# Patient Record
Sex: Female | Born: 1985 | Race: Black or African American | Hispanic: No | Marital: Single | Smoking: Former smoker
Health system: Southern US, Community
[De-identification: ages and names within clinical notes are randomized; demographics above are authoritative.]

## PROBLEM LIST (undated history)

## (undated) ENCOUNTER — Inpatient Hospital Stay (HOSPITAL_COMMUNITY): Payer: Self-pay

## (undated) DIAGNOSIS — R87629 Unspecified abnormal cytological findings in specimens from vagina: Secondary | ICD-10-CM

## (undated) DIAGNOSIS — I10 Essential (primary) hypertension: Secondary | ICD-10-CM

## (undated) DIAGNOSIS — N390 Urinary tract infection, site not specified: Secondary | ICD-10-CM

## (undated) HISTORY — PX: WISDOM TOOTH EXTRACTION: SHX21

## (undated) HISTORY — DX: Essential (primary) hypertension: I10

## (undated) HISTORY — PX: CHOLECYSTECTOMY: SHX55

## (undated) HISTORY — PX: INDUCED ABORTION: SHX677

---
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
## 2010-07-16 ENCOUNTER — Other Ambulatory Visit: Payer: Self-pay | Admitting: General Surgery

## 2010-07-16 ENCOUNTER — Inpatient Hospital Stay (HOSPITAL_COMMUNITY)
Admission: EM | Admit: 2010-07-16 | Discharge: 2010-07-17 | DRG: 419 | Disposition: A | Discharge status: Home / Self Care | Payer: Self-pay | Attending: General Surgery | Admitting: General Surgery

## 2010-07-16 ENCOUNTER — Emergency Department (HOSPITAL_COMMUNITY): Payer: Self-pay

## 2010-07-16 DIAGNOSIS — K810 Acute cholecystitis: Principal | ICD-10-CM | POA: Diagnosis present

## 2010-07-16 LAB — CBC
HCT: 39.9 % (ref 36.0–46.0)
Hemoglobin: 13.1 g/dL (ref 12.0–15.0)
MCHC: 32.8 g/dL (ref 30.0–36.0)
MCV: 82.6 fL (ref 78.0–100.0)
RDW: 14.3 % (ref 11.5–15.5)
WBC: 9.1 10*3/uL (ref 4.0–10.5)

## 2010-07-16 LAB — COMPREHENSIVE METABOLIC PANEL
ALT: 8 U/L (ref 0–35)
AST: 18 U/L (ref 0–37)
CO2: 29 mEq/L (ref 19–32)
Chloride: 99 mEq/L (ref 96–112)
GFR calc Af Amer: >60 mL/min (ref >60)
GFR calc non Af Amer: >60 mL/min (ref >60)
Glucose, Bld: 107 mg/dL — ABNORMAL HIGH (ref 70–99)
Sodium: 138 mEq/L (ref 135–145)
Total Bilirubin: 0.3 mg/dL (ref 0.3–1.2)

## 2010-07-16 LAB — DIFFERENTIAL
Basophils Absolute: 0 10*3/uL (ref 0.0–0.1)
Eosinophils Relative: 0 % (ref 0–5)
Lymphocytes Relative: 13 % (ref 12–46)
Lymphs Abs: 1.2 10*3/uL (ref 0.7–4.0)
Monocytes Absolute: 0.3 10*3/uL (ref 0.1–1.0)
Neutro Abs: 7.6 10*3/uL (ref 1.7–7.7)

## 2010-07-16 LAB — URINALYSIS, ROUTINE W REFLEX MICROSCOPIC
Bilirubin Urine: NEGATIVE
Hgb urine dipstick: NEGATIVE
Ketones, ur: 15 mg/dL — AB
Nitrite: NEGATIVE
Protein, ur: NEGATIVE mg/dL
Urobilinogen, UA: 0.2 mg/dL (ref 0.0–1.0)

## 2010-07-16 LAB — LIPASE, BLOOD: Lipase: 14 U/L (ref 11–59)

## 2010-07-26 NOTE — Op Note (Signed)
NAME:  Erin Johnston, Erin Johnston                ACCOUNT NO.:  [id number]  MEDICAL RECORD NO.:  [id number]           PATIENT TYPE:  E  LOCATION:  WLED                         FACILITY:  [facility]  PHYSICIAN:  Adolph Pollack, M.D.DATE OF BIRTH:  [birth year]  DATE OF PROCEDURE:  07/16/2010 DATE OF DISCHARGE:                              OPERATIVE REPORT   PREOPERATIVE DIAGNOSIS:  Acute cholecystitis.  POSTOPERATIVE DIAGNOSIS:  Acute cholecystitis.  PROCEDURE:  Laparoscopic cholecystectomy with cholangiogram.  SURGEON:  Adolph Pollack, M.D.  ASSISTANT:  Anselm Pancoast. Zachery Dakins, M.D.  ANESTHESIA:  General.  INDICATIONS:  The is a 25 year old female, who presented to the Emergency Department with severe epigastric pain, was found to have clinical findings and radiographic findings consistent with acute cholecystitis, is now brought to the operating room for the above procedure.  TECHNIQUE:  She is brought to the operating room, placed supine on the operating table and general anesthetic was administered.  The abdominal wall was widely sterilely prepped and draped.  Marcaine solution was infiltrated in the subumbilical region.  A small subumbilical incision was made through the skin, subcutaneous tissue, fascia and peritoneum entering the peritoneal cavity under direct vision.  A pursestring suture of 0 Vicryl was placed around the fascial edges.  A Hassan trocar was introduced into the peritoneal cavity. Pneumoperitoneum was created by insufflation of CO2 gas.  Next, the laparoscope was introduced.  There is no underlying organ injury or bleeding.  We then placed a 5-mm trocar through an epigastric incision and two 5 mm trocars in right upper quadrant.  She was placed in reverse Trendelenburg position with right side tilted slightly up.  Gallbladder was grasped and had acute inflammatory changes and edema noted with some omental adhesions, which were easy to take down bluntly.   The fundus of the gallbladder was retracted toward the right shoulder.  I then mobilized the infundibulum by dissecting some of the inflammatory peel from it and then retracted it laterally.  I identified the cystic duct and dissected some of inflammatory peel from that.  I then used blunt dissection close to the gallbladder to create a window around the cystic duct.  A clip was placed in the cystic duct gallbladder junction and small incision made in the cystic duct.  Some sludge was milked back from the cystic duct.  A cholangiocath was passed to the anterior abdominal wall placed into the cystic duct and cholangiograms performed.  Under real time fluoroscopy, dilute contrast was injected to the cystic duct, which was of moderate length.  The common hepatic, right hepatic and bile ducts filled and contrast drained promptly into the duodenum. No obvious obstructing stones present, possibly some sludge that got flushed through.  Final reports pending radiologist's interpretation.  Following this, the cholangiocatheter was removed.  The cystic duct was clipped three times with the biliary side divided.  I then identified an anterior-posterior branch of the cystic artery close to the gallbladder. These were clipped and divided.  These all were dissected free from liver intact using electrocautery and placed in Endopouch bag.  I then removed the Endopouch bag through the  subumbilical incision and replaced the subumbilical trocar.  I copiously irrigated out the gallbladder fossa.  There is no evidence of bleeding or bile leak. Irrigation fluid was evacuated as possible.  I then removed the subumbilical trocar under laparoscopic vision closed with the fascial defect by tightening up and down the pursestring suture.  The CO2 gas was removed and the remaining trocars removed.  Skin incisions were closed using 4-0 Monocryl subcuticular stitches. Steri-Strips and sterile dressings were  applied.  She tolerated the procedure without any apparent complications and was taken to recovery room in satisfactory condition.     Adolph Pollack, M.D.     Kari Baars  D:  07/16/2010  T:  07/16/2010  Job:  [id number]  Electronically Signed by Avel Peace M.D. on 07/26/2010 10:52:59 AM

## 2010-07-26 NOTE — H&P (Signed)
  NAME:  Erin Johnston, Erin Johnston                ACCOUNT NO.:  [id number]  MEDICAL RECORD NO.:  [id number]           PATIENT TYPE:  E  LOCATION:  WLED                         FACILITY:  [facility]  PHYSICIAN:  Adolph Pollack, M.D.DATE OF BIRTH:  May 20, 1985  DATE OF ADMISSION:  07/16/2010 DATE OF DISCHARGE:                             HISTORY & PHYSICAL   REASON FOR ADMISSION:  Acute cholecystitis.  HISTORY:  This is a 25 year old female who awoke this morning about 2 o'clock with severe pressure-type epigastric pain radiating to the scapular area associated with nausea and vomiting.  She said she has had other episodes like this, but not nearly as bad over the past 2 years. She presented to the emergency department for evaluation, was noted to have fairly significant right upper quadrant tenderness and guarding. An ultrasound was performed demonstrating gallstones with gallbladder wall thickening.  I was asked to see at that time.  PAST MEDICAL HISTORY:  No chronic illnesses.  PREVIOUS OPERATIONS:  None.  ALLERGIES TO MEDICATIONS:  None.  SOCIAL HISTORY:  She is single, has 2 small children.  Her mother is here with her.  No tobacco or alcohol use.  REVIEW OF SYSTEMS:  CARDIAC:  No heart disease or hypertension. PULMONARY:  No asthma, pneumonia.  GI:  No peptic ulcer disease, hepatitis.  ENDOCRINE:  No diabetes, thyroid disease or hypercholesterolemia.  NEUROLOGIC:  No seizures.  HEMATOLOGIC:  No bleeding disorders or blood clots.  PHYSICAL EXAMINATION:  GENERAL:  Slightly overweight female, in no acute distress, pleasant, cooperative. VITAL SIGNS:  Temperature is 97.9, blood pressure is 128/86, pulse is 60, respiratory rate 18, O2 saturations 100% on room air. NECK:  Supple without masses. RESPIRATORY:  Breath sounds equal and clear, respirations unlabored. CARDIOVASCULAR:  Demonstrates regular rate, regular rhythm.  There is no murmur. ABDOMEN:  Soft, some right upper quadrant  tenderness, but no palpable mass.  No organomegaly. MUSCULOSKELETAL:  No edema.  Good range of motion.  LABORATORY DATA:  Liver function tests are not elevated.  Lipase normal. Urine pregnancy test negative.  White cell count is 9100 with left shift.  Hemoglobin 13.1.  Ultrasound reviewed demonstrating cholelithiasis with gallbladder wall thickening and gallbladder distention.  IMPRESSION:  Acute cholecystitis.  PLAN:  IV antibiotics.  Laparoscopic cholecystectomy, possible open.  I have discussed the procedure and the risks with her and her mother.  We also discussed aftercare.  Risks include but are not limited to bleeding, infection, wound healing problems, anesthesia, bile leak, diarrhea, accidental injury to common bile duct/liver/intestine.  They both seem to understand and agree to plan.     Adolph Pollack, M.D.     Kari Baars  D:  07/16/2010  T:  07/16/2010  Job:  [id number]  Electronically Signed by Avel Peace M.D. on 07/26/2010 10:53:16 AM

## 2012-01-14 ENCOUNTER — Encounter (HOSPITAL_COMMUNITY): Payer: Self-pay | Admitting: *Deleted

## 2012-01-14 ENCOUNTER — Emergency Department (HOSPITAL_COMMUNITY)
Admission: EM | Admit: 2012-01-14 | Discharge: 2012-01-14 | Discharge status: Against Medical Advice | Payer: Self-pay | Attending: Emergency Medicine | Admitting: Emergency Medicine

## 2012-01-14 ENCOUNTER — Emergency Department (HOSPITAL_COMMUNITY)
Admission: EM | Admit: 2012-01-14 | Discharge: 2012-01-14 | Disposition: A | Discharge status: Home / Self Care | Payer: Self-pay | Attending: Emergency Medicine | Admitting: Emergency Medicine

## 2012-01-14 ENCOUNTER — Encounter (HOSPITAL_COMMUNITY): Payer: Self-pay | Admitting: Emergency Medicine

## 2012-01-14 DIAGNOSIS — F17200 Nicotine dependence, unspecified, uncomplicated: Secondary | ICD-10-CM | POA: Insufficient documentation

## 2012-01-14 DIAGNOSIS — R599 Enlarged lymph nodes, unspecified: Secondary | ICD-10-CM | POA: Insufficient documentation

## 2012-01-14 DIAGNOSIS — R21 Rash and other nonspecific skin eruption: Secondary | ICD-10-CM | POA: Insufficient documentation

## 2012-01-14 DIAGNOSIS — R220 Localized swelling, mass and lump, head: Secondary | ICD-10-CM | POA: Insufficient documentation

## 2012-01-14 DIAGNOSIS — R221 Localized swelling, mass and lump, neck: Secondary | ICD-10-CM | POA: Insufficient documentation

## 2012-01-14 LAB — RAPID STREP SCREEN (MED CTR MEBANE ONLY): Streptococcus, Group A Screen (Direct): NEGATIVE

## 2012-01-14 MED ORDER — HYDROXYZINE HCL 25 MG PO TABS: 25.0000 mg | ORAL_TABLET | Freq: Four times a day (QID) | ORAL | Status: DC

## 2012-01-14 NOTE — ED Notes (Signed)
Pt not present for discharge, will keep discharge paperwork at nurses station if the pt returns

## 2012-01-14 NOTE — ED Notes (Signed)
Pt c/o sore throat and rash under tongue onset 3-4 weeks ago. Pt c/o itching feeling in throat. Pt denies shortness of breath. Pt talking in complete sentences without difficulty.

## 2012-01-14 NOTE — ED Notes (Addendum)
Pt's named was called x3, no answer.  Looked for pt outside and didn't see her.  Also waited before removing pt from room.

## 2012-01-14 NOTE — ED Notes (Signed)
Pt c/o pain under tongue when eating. States after eating the bottom of tongue itches. Also reports "lymph nodes are swollen in my neck."

## 2012-01-14 NOTE — ED Provider Notes (Signed)
History  This chart was scribed for Raeford Razor, MD by Ladona Ridgel Day, ED scribe. This patient was seen in room TR10C/TR10C and the patient's care was started at 1308.   CSN: [id number]  Arrival date & time 01/14/12  1308   None     Chief Complaint  Patient presents with  . Sore Throat  . Rash   Patient is a 26 y.o. female presenting with pharyngitis and rash. The history is provided by the patient. No language interpreter was used.  Sore Throat This is a new problem. The current episode started more than 1 week ago. The problem occurs constantly. The problem has not changed since onset.Pertinent negatives include no abdominal pain and no shortness of breath. The symptoms are aggravated by eating. Nothing relieves the symptoms. Treatments tried: ibuprofen  The treatment provided mild relief.  Rash   Erin Johnston is a 26 y.o. female who presents to the Emergency Department complaining of itchy rash which she states is around both sides of her neck under her jaw as well as under her tongue; both of which are itchy, non-erythematous, no drainage for the past month and a half. She tried taking ibuprofen which she states reduced some of the swelling. She states eating makes her symptoms worse and makes her neck rash more itchy. She denies SOB or dizziness.  History reviewed. No pertinent past medical history.  Past Surgical History  Procedure Date  . Cholecystectomy     No family history on file.  History  Substance Use Topics  . Smoking status: Current Every Day Smoker  . Smokeless tobacco: Not on file  . Alcohol Use: No    OB History    Grav Para Term Preterm Abortions TAB SAB Ect Mult Living                  Review of Systems  Constitutional: Negative for fever and chills.  Respiratory: Negative for shortness of breath.   Gastrointestinal: Negative for nausea, vomiting and abdominal pain.  Skin: Positive for rash (around under both sides of her neck and under her tongue  with itchy rash).  Neurological: Negative for weakness.  All other systems reviewed and are negative.    Allergies  Review of patient's allergies indicates no known allergies.  Home Medications   Current Outpatient Rx  Name  Route  Sig  Dispense  Refill  . IBUPROFEN 200 MG PO TABS   Oral   Take 200 mg by mouth every 6 (six) hours as needed. For pain           Triage vitals: BP 120/84  Pulse 75  Temp 99.1 F (37.3 C) (Oral)  Resp 18  SpO2 100%  LMP 12/31/2011  Physical Exam  Nursing note and vitals reviewed. Constitutional: She appears well-developed and well-nourished. No distress.  HENT:  Head: Normocephalic and atraumatic.       No facial swelling. Submental tissues are soft. Posterior pharynx clear, uvula midline, normal phonation, no tongue elevation. Dentition intact. Sublingual tissues are grossly normal. No stridor, no carotid bruit.   Eyes: Conjunctivae normal are normal. Right eye exhibits no discharge. Left eye exhibits no discharge.  Neck: Neck supple.  Cardiovascular: Normal rate, regular rhythm and normal heart sounds.  Exam reveals no gallop and no friction rub.   No murmur heard. Pulmonary/Chest: Effort normal and breath sounds normal. No stridor. No respiratory distress.  Abdominal: Soft. She exhibits no distension. There is no tenderness.  Musculoskeletal: She exhibits no  edema and no tenderness.  Lymphadenopathy:    She has no cervical adenopathy.  Neurological: She is alert.  Skin: Skin is warm and dry.  Psychiatric: She has a normal mood and affect. Her behavior is normal. Thought content normal.    ED Course  Procedures (including critical care time) DIAGNOSTIC STUDIES: Oxygen Saturation is 100% on room air, normal by my interpretation.    COORDINATION OF CARE: At 330 PM Discussed treatment plan with patient which includes rapid strept screen. Patient agrees.    Labs Reviewed  RAPID STREP SCREEN  LAB REPORT - SCANNED   No results  found.   1. Facial swelling       MDM  26yF with facial swelling which is more subjective than anything. No objective findings on my exam. No respiratory distress. No oral lesions noted. Etiology of pt's complaints not clear to me at this time. NO evidence of deep space infection or airway compromise. Plan symptomatic tx of pruritis.   I personally preformed the services scribed in my presence. The recorded information has been reviewed/is accurate. Raeford Razor, MD.          Raeford Razor, MD 01/18/12 [phone]

## 2012-01-14 NOTE — ED Notes (Signed)
Pt not present in room to discuss discharge paperwork, will recheck to discuss discharge paperwork

## 2013-02-27 NOTE — L&D Delivery Note (Signed)
Vaginal Delivery Note The pt utilized an epidural as pain management.   Spontaneous rupture of membranes today, at 0030, clear.  GBS was negative  Cervical dilation was complete at  1534.  NICHD Category 2.    Pushing with guidance began at  1538.   After 30 seconds of pushing the head, shoulders and the body of a viable female infant "November" delivered spontaneously with maternal effort in the LOA position at 1538   With vigorous tone and spontaneous cry, the infant was placed on moms abd.  After the umbilical cord was clamped it was cut by the FOB, then cord blood was obtained for evaluation.  Spontaneous delivery of a intact placenta with a 3 vessel cord via Shultz at  [phone].   Episiotomy: None   The vulva, perineum, vaginal vault, rectum and cervix were inspected and revealed a superficial bilateral periurethral laceration which was repaired using a 4-0 vicryl on a SH  Needle.  Lidocaine was not used, the epidural was sufficient for the repair.    Postpartum pitocin as ordered.  Fundus firm, lochia minimum, bleeding under control.  EBL 50, Pt hemodynamically stable.   Sponge, laps and needle count correct and verified with the primary care nurse.   Attending MD available at all times.    Mom and baby were left in stable condition, baby skin to skin. Routine postpartum orders   Mother unsure about method of contraception   Placenta to pathology: NO     Cord Gases sent to lab: NO Cord blood sent to lab: YES   APGARS:  8 at 1 minute and 9 at 5 minutes Weight:. 7lb 11.1oz     Thaer Miyoshi, CNM, MSN 12/30/2013. 4:17 PM

## 2013-05-26 ENCOUNTER — Encounter (HOSPITAL_COMMUNITY): Payer: Self-pay | Admitting: Emergency Medicine

## 2013-05-26 ENCOUNTER — Emergency Department (HOSPITAL_COMMUNITY)
Admission: EM | Admit: 2013-05-26 | Discharge: 2013-05-26 | Disposition: A | Discharge status: Home / Self Care | Payer: Medicaid Other | Attending: Emergency Medicine | Admitting: Emergency Medicine

## 2013-05-26 DIAGNOSIS — R110 Nausea: Secondary | ICD-10-CM | POA: Insufficient documentation

## 2013-05-26 DIAGNOSIS — O9989 Other specified diseases and conditions complicating pregnancy, childbirth and the puerperium: Secondary | ICD-10-CM | POA: Insufficient documentation

## 2013-05-26 DIAGNOSIS — Z3490 Encounter for supervision of normal pregnancy, unspecified, unspecified trimester: Secondary | ICD-10-CM

## 2013-05-26 DIAGNOSIS — O99330 Smoking (tobacco) complicating pregnancy, unspecified trimester: Secondary | ICD-10-CM | POA: Insufficient documentation

## 2013-05-26 LAB — PREGNANCY, URINE: Preg Test, Ur: POSITIVE — AB

## 2013-05-26 NOTE — ED Provider Notes (Signed)
CSN: [id number]     Arrival date & time 05/26/13  0912 History   First MD Initiated Contact with Patient 05/26/13 1002     Chief Complaint  Patient presents with  . Nausea     (Consider location/radiation/quality/duration/timing/severity/associated sxs/prior Treatment) HPI Pt is a 28yo female c/o 2 weeks of constant daily nausea w/o vomiting. Pt has not tried any medication at home for nausea.Pt reports having unprotected sex, is not on birth control. Is concerned for pregnancy. Pt is currently G2PAL2.  LMP was 04/09/13. Denies fever, urinary or vaginal symptoms. Denies abdominal pain.  Denies concern for STD. No other significant PMH.   History reviewed. No pertinent past medical history. Past Surgical History  Procedure Laterality Date  . Cholecystectomy     No family history on file. History  Substance Use Topics  . Smoking status: Current Every Day Smoker    Types: Cigarettes  . Smokeless tobacco: Not on file  . Alcohol Use: No   OB History   Grav Para Term Preterm Abortions TAB SAB Ect Mult Living                 Review of Systems  Constitutional: Negative for fever and chills.  Respiratory: Negative for cough.   Cardiovascular: Negative for chest pain.  Gastrointestinal: Positive for nausea. Negative for vomiting, abdominal pain, diarrhea and constipation.  Genitourinary: Negative for dysuria, urgency, frequency, flank pain, decreased urine volume, vaginal bleeding, vaginal discharge, vaginal pain and pelvic pain.  Musculoskeletal: Negative for back pain.  All other systems reviewed and are negative.      Allergies  Review of patient's allergies indicates no known allergies.  Home Medications  No current outpatient prescriptions on file. BP 121/64  Pulse 80  Temp(Src) 98.5 F (36.9 C)  Resp 20  SpO2 95%  LMP 04/09/2013 Physical Exam  Nursing note and vitals reviewed. Constitutional: She appears well-developed and well-nourished. No distress.  HENT:   Head: Normocephalic and atraumatic.  Eyes: Conjunctivae are normal. No scleral icterus.  Neck: Normal range of motion.  Cardiovascular: Normal rate, regular rhythm and normal heart sounds.   Pulmonary/Chest: Effort normal. No respiratory distress.  Abdominal: Soft. She exhibits no distension. There is no tenderness.  Musculoskeletal: Normal range of motion.  Neurological: She is alert.  Skin: Skin is warm and dry. She is not diaphoretic.  Psychiatric: She has a normal mood and affect. Her behavior is normal.    ED Course  Procedures (including critical care time) Labs Review Labs Reviewed  PREGNANCY, URINE - Abnormal; Notable for the following:    Preg Test, Ur POSITIVE (*)    All other components within normal limits   Imaging Review No results found.   EKG Interpretation None      MDM   Final diagnoses:  Pregnant  Nausea   Pt with concern for pregnancy c/o nausea x2 weeks, LMP 04/09/13.  Pt appears well, non-toxic, benign abdominal exam. Afebrile. Urine preg performed in ED-positive. Discussed results with pt. Advised to f/u with PCP, resource guide provided, as well as [facility] for [facility]. Return precautions provided. Pt verbalized understanding and agreement with tx plan.      Junius Finnerrin O'Malley, PA-C 05/26/13 [phone]

## 2013-05-26 NOTE — ED Notes (Signed)
Nausea x two wks; no other complaints; lmp 04/09/13

## 2013-05-26 NOTE — ED Notes (Signed)
Pt states here for pregnancy test

## 2013-05-26 NOTE — Progress Notes (Signed)
[facility] provided pt with a list of primary care resources. Patient stated that she was pending insurance through job.  °

## 2013-05-26 NOTE — ED Provider Notes (Signed)
Medical screening examination/treatment/procedure(s) were performed by non-physician practitioner and as supervising physician I was immediately available for consultation/collaboration.  Megan E Docherty, MD 05/26/13 1631 

## 2013-07-11 ENCOUNTER — Inpatient Hospital Stay (HOSPITAL_COMMUNITY)
Admission: AD | Admit: 2013-07-11 | Discharge: 2013-07-11 | Disposition: A | Discharge status: Home / Self Care | Payer: Medicaid Other | Source: Ambulatory Visit | Attending: Obstetrics & Gynecology | Admitting: Obstetrics & Gynecology

## 2013-07-11 ENCOUNTER — Encounter (HOSPITAL_COMMUNITY): Payer: Self-pay

## 2013-07-11 DIAGNOSIS — Z3201 Encounter for pregnancy test, result positive: Secondary | ICD-10-CM | POA: Insufficient documentation

## 2013-07-11 NOTE — MAU Note (Signed)
Pt states here for verification letter. Denies pain or bleeding. Needs EDD for insurance purposes.

## 2013-07-11 NOTE — MAU Provider Note (Signed)
  HPI:  Ms. Erin CoxBrittney S Kowalchuk is a 28 y.o. female G1P0 at 10680w4d who presents for a pregnancy verification letter. She currently has no pain or no bleeding. + FHT's in triage.    Objective:  GENERAL: Well-developed, well-nourished female in no acute distress.  HEENT: Normocephalic, atraumatic.   LUNGS: Effort normal HEART: Regular rate  SKIN: Warm, dry and without erythema PSYCH: Normal mood and affect  Filed Vitals:   07/11/13 1022  BP: 112/67  Pulse: 86  Temp: 98.4 F (36.9 C)  Resp: 18     MDM: +fht  No concerns at this time  A:  1. Pregnancy test performed, pregnancy confirmed    P:  Discharge home in stable condition Pregnancy verification letter given  Return to MAU as needed; warning signs discussed  Start prenatal care ASAP  Iona HansenJennifer Irene Aubria Vanecek, NP 07/11/2013 3:53 PM

## 2013-09-01 ENCOUNTER — Inpatient Hospital Stay (HOSPITAL_COMMUNITY)
Admission: AD | Admit: 2013-09-01 | Discharge: 2013-09-01 | Disposition: A | Discharge status: Home / Self Care | Payer: Medicaid Other | Source: Ambulatory Visit | Attending: Family Medicine | Admitting: Family Medicine

## 2013-09-01 ENCOUNTER — Encounter (HOSPITAL_COMMUNITY): Payer: Self-pay

## 2013-09-01 DIAGNOSIS — O0930 Supervision of pregnancy with insufficient antenatal care, unspecified trimester: Secondary | ICD-10-CM | POA: Diagnosis not present

## 2013-09-01 DIAGNOSIS — O99891 Other specified diseases and conditions complicating pregnancy: Secondary | ICD-10-CM | POA: Diagnosis not present

## 2013-09-01 DIAGNOSIS — Z3201 Encounter for pregnancy test, result positive: Secondary | ICD-10-CM

## 2013-09-01 DIAGNOSIS — O9989 Other specified diseases and conditions complicating pregnancy, childbirth and the puerperium: Principal | ICD-10-CM

## 2013-09-01 HISTORY — DX: Urinary tract infection, site not specified: N39.0

## 2013-09-01 LAB — URINE MICROSCOPIC-ADD ON

## 2013-09-01 LAB — URINALYSIS, ROUTINE W REFLEX MICROSCOPIC
Bilirubin Urine: NEGATIVE
Glucose, UA: NEGATIVE mg/dL
HGB URINE DIPSTICK: NEGATIVE
Ketones, ur: NEGATIVE mg/dL
Nitrite: NEGATIVE
PH: 7 (ref 5.0–8.0)
Protein, ur: NEGATIVE mg/dL
SPECIFIC GRAVITY, URINE: 1.01 (ref 1.005–1.030)
UROBILINOGEN UA: 1 mg/dL (ref 0.0–1.0)

## 2013-09-01 NOTE — MAU Note (Signed)
Patient states she has had no prenatal care and wants to establish care. Patient denies pain, bleeding, leaking and has felt fetal movement.

## 2013-09-01 NOTE — MAU Provider Note (Signed)
[name] is a 28 y.o. [id number] at 6920w0d by LMP who presents to MAU today to start prenatal care. The patient states that [facility] and was not given a pregnancy confirmation letter, so she has not been able to switch to pregnancy medicaid and start prenatal care. She denies abdominal pain, bleeding, discharge, N/V or fever today.   BP 124/73  Pulse 116  Temp(Src) 98.5 F (36.9 C) (Oral)  Resp 16  Ht 4\' 11"  (1.499 m)  Wt 139 lb 3.2 oz (63.141 kg)  BMI 28.10 kg/m2  SpO2 100%  LMP 04/07/2013 GENERAL: Well-developed, well-nourished female in no acute distress.  HEENT: Normocephalic, atraumatic.   LUNGS: Effort normal HEART: Regular rate  GU: Gravid uterus SKIN: Warm, dry and without erythema PSYCH: Normal mood and affect   MDM FHR - 150 bpm with doppler  A: Positive pregnancy test No prenatal care  P: Discharge home US for anatomy scheduled for 09/11/13 @ 2:00 pm [facility] Pregnancy confirmation letter given Second trimester warning signs discussed Patient may return to MAU as needed or if her condition were to change or worsen  Erin StarrJulie N Ethier, PA-C  09/01/2013 1:25 PM

## 2013-09-01 NOTE — MAU Provider Note (Signed)
Attestation of Attending Supervision of Advanced Practitioner (PA/CNM/NP): Evaluation and management procedures were performed by the Advanced Practitioner under my supervision and collaboration.  I have reviewed the Advanced Practitioner's note and chart, and I agree with the management and plan.  Reva BoresPRATT,TANYA S, MD [facility] Attending 09/01/2013 2:13 PM

## 2013-09-01 NOTE — Discharge Instructions (Signed)
Prenatal Care  WHAT IS PRENATAL CARE?  Prenatal care means health care during your pregnancy, before your baby is born. It is very important to take care of yourself and your baby during your pregnancy by:   Getting early prenatal care. If you know you are pregnant, or think you might be pregnant, call your health care provider as soon as possible. Schedule a visit for a prenatal exam.  Getting regular prenatal care. Follow your health care provider's schedule for blood and other necessary tests. Do not miss appointments.  Doing everything you can to keep yourself and your baby healthy during your pregnancy.  Getting complete care. Prenatal care should include evaluation of the medical, dietary, educational, psychological, and social needs of you and your significant other. The medical and genetic history of your family and the family of your baby's father should be discussed with your health care provider.  Discussing with your health care provider:  Prescription, over-the-counter, and herbal medicines that you take.  Any history of substance abuse, alcohol use, smoking, and illegal drug use.  Any history of domestic abuse and violence.  Immunizations you have received.  Your nutrition and diet.  The amount of exercise you do.  Any environmental and occupational hazards to which you are exposed.  History of sexually transmitted infections for both you and your partner.  Previous pregnancies you have had. WHY IS PRENATAL CARE SO IMPORTANT?  By regularly seeing your health care provider, you help ensure that problems can be identified early so that they can be treated as soon as possible. Other problems might be prevented. Many studies have shown that early and regular prenatal care is important for the health of mothers and their babies.  HOW CAN I TAKE CARE OF MYSELF WHILE I AM PREGNANT?  Here are ways to take care of yourself and your baby:   Start or continue taking your  multivitamin with 400 micrograms (mcg) of folic acid every day.  Get early and regular prenatal care. It is very important to see a health care provider during your pregnancy. Your health care provider will check at each visit to make sure that you and your baby are healthy. If there are any problems, action can be taken right away to help you and your baby.  Eat a healthy diet that includes:  Fruits.  Vegetables.  Foods low in saturated fat.  Whole grains.  Calcium-rich foods, such as milk, yogurt, and hard cheeses.  Drink 6-8 glasses of liquids a day.  Unless your health care provider tells you not to, try to be physically active for 30 minutes, most days of the week. If you are pressed for time, you can get your activity in through 10-minute segments, three times a day.  Do not smoke, drink alcohol, or use drugs. These can cause long-term damage to your baby. Talk with your health care provider about steps to take to stop smoking. Talk with a member of your faith community, a counselor, a trusted friend, or your health care provider if you are concerned about your alcohol or drug use.  Ask your health care provider before taking any medicine, even over-the-counter medicines. Some medicines are not safe to take during pregnancy.  Get plenty of rest and sleep.  Avoid hot tubs and saunas during pregnancy.  Do not have X-rays taken unless absolutely necessary and with the recommendation of your health care provider. A lead shield can be placed on your abdomen to protect your baby when   X-rays are taken in other parts of your body.  Do not empty the cat litter when you are pregnant. It may contain a parasite that causes an infection called toxoplasmosis, which can cause birth defects. Also, use gloves when working in garden areas used by cats.  Do not eat uncooked or undercooked meats or fish.  Do not eat soft, mold-ripened cheeses (Brie, Camembert, and chevre) or soft, blue-veined  cheese (Danish blue and Roquefort).  Stay away from toxic chemicals like:  Insecticides.  Solvents (some cleaners or paint thinners).  Lead.  Mercury.  Sexual intercourse may continue until the end of the pregnancy, unless you have a medical problem or there is a problem with the pregnancy and your health care provider tells you not to.  Do not wear Wiehe-heel shoes, especially during the second half of the pregnancy. You can lose your balance and fall.  Do not take long trips, unless absolutely necessary. Be sure to see your health care provider before going on the trip.  Do not sit in one position for more than 2 hours when on a trip.  Take a copy of your medical records when going on a trip. Know where a hospital is located in the city you are visiting, in case of an emergency.  Most dangerous household products will have pregnancy warnings on their labels. Ask your health care provider about products if you are unsure.  Limit or eliminate your caffeine intake from coffee, tea, sodas, medicines, and chocolate.  Many women continue working through pregnancy. Staying active might help you stay healthier. If you have a question about the safety or the hours you work at your particular job, talk with your health care provider.  Get informed:  Read books.  Watch videos.  Go to childbirth classes for you and your significant other.  Talk with experienced moms.  Ask your health care provider about childbirth education classes for you and your partner. Classes can help you and your partner prepare for the birth of your baby.  Ask about a baby doctor (pediatrician) and methods and pain medicine for labor, delivery, and possible cesarean delivery. HOW OFTEN SHOULD I SEE MY HEALTH CARE PROVIDER DURING PREGNANCY?  Your health care provider will give you a schedule for your prenatal visits. You will have visits more often as you get closer to the end of your pregnancy. An average  pregnancy lasts about 40 weeks.  A typical schedule includes visiting your health care provider:   About once each month during your first 6 months of pregnancy.  Every 2 weeks during the next 2 months.  Weekly in the last month, until the delivery date. Your health care provider will probably want to see you more often if:  You are older than 35 years.  Your pregnancy is Vaile risk because you have certain health problems or problems with the pregnancy, such as:  Diabetes.  Cre[place] blood pressure.  The baby is not growing on schedule, according to the dates of the pregnancy. Your health care provider will do special tests to make sure you and your baby are not having any serious problems. WHAT HAPPENS DURING PRENATAL VISITS?   At your first prenatal visit, your health care provider will do a physical exam and talk to you about your health history and the health history of your partner and your family. Your health care provider will be able to tell you what date to expect your baby to be born on.  Your   first physical exam will include checks of your blood pressure, measurements of your height and weight, and an exam of your pelvic organs. Your health care provider will do a Pap test if you have not had one recently and will do cultures of your cervix to make sure there is no infection.  At each prenatal visit, there will be tests of your blood, urine, blood pressure, weight, and the progress of the baby will be checked.  At your later prenatal visits, your health care provider will check how you are doing and how your baby is developing. You may have a number of tests done as your pregnancy progresses.  Ultrasound exams are often used to check on your baby's growth and health.  You may have more urine and blood tests, as well as special tests, if needed. These may include amniocentesis to examine fluid in the pregnancy sac, stress tests to check how the baby responds to contractions, or a  biophysical profile to measure your baby's well-being. Your health care provider will explain the tests and why they are necessary.  You should be tested for [place] blood sugar (gestational diabetes) between the 24th and 28th weeks of your pregnancy.  You should discuss with your health care provider your plans to breastfeed or bottle-feed your baby.  Each visit is also a chance for you to learn about staying healthy during pregnancy and to ask questions. Document Released: 02/16/2003 Document Revised: 02/18/2013 Document Reviewed: 08/01/2012 ExitCare Patient Information 2015 [employer]. This information is not intended to replace advice given to you by your health care provider. Make sure you discuss any questions you have with your health care provider.  

## 2013-09-11 ENCOUNTER — Ambulatory Visit (HOSPITAL_COMMUNITY)
Admit: 2013-09-11 | Discharge: 2013-09-11 | Disposition: A | Discharge status: Home / Self Care | Payer: Medicaid Other | Attending: Medical | Admitting: Medical

## 2013-09-11 DIAGNOSIS — Z3201 Encounter for pregnancy test, result positive: Secondary | ICD-10-CM

## 2013-09-11 DIAGNOSIS — Z3689 Encounter for other specified antenatal screening: Secondary | ICD-10-CM | POA: Insufficient documentation

## 2013-09-19 LAB — OB RESULTS CONSOLE RUBELLA ANTIBODY, IGM: RUBELLA: NON-IMMUNE/NOT IMMUNE

## 2013-09-19 LAB — OB RESULTS CONSOLE ABO/RH: RH Type: POSITIVE

## 2013-09-19 LAB — OB RESULTS CONSOLE HIV ANTIBODY (ROUTINE TESTING): HIV: NONREACTIVE

## 2013-09-19 LAB — OB RESULTS CONSOLE RPR: RPR: NONREACTIVE

## 2013-09-19 LAB — OB RESULTS CONSOLE HEPATITIS B SURFACE ANTIGEN: HEP B S AG: NEGATIVE

## 2013-09-19 LAB — OB RESULTS CONSOLE ANTIBODY SCREEN: ANTIBODY SCREEN: NEGATIVE

## 2013-09-25 ENCOUNTER — Encounter: Payer: Medicaid Other | Admitting: Family

## 2013-09-25 ENCOUNTER — Emergency Department (HOSPITAL_COMMUNITY)
Admission: EM | Admit: 2013-09-25 | Discharge: 2013-09-25 | Disposition: A | Discharge status: Home / Self Care | Payer: Medicaid Other | Attending: Emergency Medicine | Admitting: Emergency Medicine

## 2013-09-25 ENCOUNTER — Encounter (HOSPITAL_COMMUNITY): Payer: Self-pay | Admitting: Emergency Medicine

## 2013-09-25 DIAGNOSIS — Z87891 Personal history of nicotine dependence: Secondary | ICD-10-CM | POA: Insufficient documentation

## 2013-09-25 DIAGNOSIS — Z87440 Personal history of urinary (tract) infections: Secondary | ICD-10-CM | POA: Insufficient documentation

## 2013-09-25 DIAGNOSIS — Z792 Long term (current) use of antibiotics: Secondary | ICD-10-CM | POA: Diagnosis not present

## 2013-09-25 DIAGNOSIS — S0510XA Contusion of eyeball and orbital tissues, unspecified eye, initial encounter: Secondary | ICD-10-CM | POA: Insufficient documentation

## 2013-09-25 DIAGNOSIS — S058X9A Other injuries of unspecified eye and orbit, initial encounter: Secondary | ICD-10-CM | POA: Diagnosis not present

## 2013-09-25 DIAGNOSIS — Y929 Unspecified place or not applicable: Secondary | ICD-10-CM | POA: Insufficient documentation

## 2013-09-25 DIAGNOSIS — S0502XA Injury of conjunctiva and corneal abrasion without foreign body, left eye, initial encounter: Secondary | ICD-10-CM

## 2013-09-25 DIAGNOSIS — Y9389 Activity, other specified: Secondary | ICD-10-CM | POA: Diagnosis not present

## 2013-09-25 DIAGNOSIS — X58XXXA Exposure to other specified factors, initial encounter: Secondary | ICD-10-CM | POA: Insufficient documentation

## 2013-09-25 MED ORDER — ERYTHROMYCIN 5 MG/GM OP OINT: TOPICAL_OINTMENT | OPHTHALMIC | Status: DC

## 2013-09-25 MED ORDER — FLUORESCEIN SODIUM 1 MG OP STRP
1.0000 | ORAL_STRIP | Freq: Once | OPHTHALMIC | Status: DC
Start: 2013-09-25 — End: 2013-09-25
  Filled 2013-09-25: qty 1

## 2013-09-25 MED ORDER — TETRACAINE HCL 0.5 % OP SOLN
1.0000 [drp] | Freq: Once | OPHTHALMIC | Status: DC
  Filled 2013-09-25: qty 2

## 2013-09-25 NOTE — Progress Notes (Signed)
  CARE MANAGEMENT ED NOTE 09/25/2013  Patient:  Erin Johnston,Erin Johnston   Account Number:  [id number]  Date Initiated:  09/25/2013  Documentation initiated by:  Edd ArbourGIBBS,Sanaa Zilberman  Subjective/Objective Assessment:   28 yr old medicaid [place] pt c/o left eye injury after getting poked by a finger 15 hours ago. The irritation is worse with blinking, mild swelling     Subjective/Objective Assessment Detail:   Pt states she has medicaid after having her children Pt confirms she has an OB GYN and inquired if seeing another medicaid MD would "mess up seeing my OB GYN" Pt referred to DSS for verification of no interruption in her OB GYN services  Pt very appreciative of resources and information offered     Action/Plan:   ED CM reviewed EPIC, spoke with pt, Provided pt a list of [place] [employer] [place], optometrist & opthalmology providers and a list of [place] self pay providers   Action/Plan Detail:   Anticipated DC Date:  09/25/2013     Status Recommendation to Physician:   Result of Recommendation:    Other ED Services  Consult Working Plan    DC Planning Services  Other  Outpatient Services - Pt will follow up  PCP issues    Choice offered to / List presented to:            Status of service:  Completed, signed off  ED Comments:   ED Comments Detail:

## 2013-09-25 NOTE — ED Provider Notes (Signed)
CSN: [id number]     Arrival date & time 09/25/13  1339 History  This chart was scribed for Terri Piedraourtney Forcucci PA-C working with Hurman HornJohn M Bednar, MD by Ashley JacobsBrittany Andrews, ED scribe. This patient was seen in room WTR8/WTR8 and the patient's care was started at 3:25s PM   First MD Initiated Contact with Patient 09/25/13 1411     Chief Complaint  Patient presents with  . Eye Injury    left   Patient is a 28 y.o. female presenting with eye injury. The history is provided by the patient and medical records. No language interpreter was used.  Eye Injury  Eye Injury   HPI Comments: Angela CoxBrittney S Stansbury is a 28 y.o. female who presents to the Emergency Department complaining of left eye injury after getting poked by a finger 15 hours ago. The irritation is worse with blinking. She has mild swelling around her eye. Pt has assocaited irritation, eye watering, and redness. She has tried placing a wash cloth over her eye and denies trying anything else. Nothing seems to help. Denies eye pain. Denies prior eye injury. Denies photophobia and visual disturbance.  Denies any possibility of foreign objects in her eye. Denies taking any medication on a regular basis. Denies any prior head conditions.  She does not have an optometrist.  Past Medical History  Diagnosis Date  . UTI (urinary tract infection)    Past Surgical History  Procedure Laterality Date  . Cholecystectomy    . Induced abortion     Family History  Problem Relation Age of Onset  . Hypertension Mother    History  Substance Use Topics  . Smoking status: Former Smoker    Types: Cigarettes  . Smokeless tobacco: Not on file  . Alcohol Use: No   OB History   Grav Para Term Preterm Abortions TAB SAB Ect Mult Living   5 2 2  2 2    2      Review of Systems  Eyes: Positive for discharge and redness. Negative for photophobia, pain and visual disturbance.       Eye irritation   All other systems reviewed and are negative.     Allergies   Tomato  Home Medications   Prior to Admission medications   Medication Sig Start Date End Date Taking? Authorizing Provider  Prenatal Vit-Fe Fumarate-FA (PRENATAL MULTIVITAMIN) TABS tablet Take 1 tablet by mouth every morning.   Yes Historical Provider, MD  erythromycin ophthalmic ointment Place a 1/2 inch ribbon of ointment into the lower eyelid. Twice daily for 5-7 days 09/25/13   Toni Amendourtney A Forcucci, PA-C   BP 123/69  Pulse 98  Temp(Src) 97.8 F (36.6 C) (Oral)  Resp 18  SpO2 98%  LMP 04/07/2013 Physical Exam  Nursing note and vitals reviewed. Constitutional: She is oriented to person, place, and time. She appears well-developed and well-nourished.  HENT:  Head: Normocephalic and atraumatic.  Mouth/Throat: Oropharynx is clear and moist. No oropharyngeal exudate.  Eyes: EOM and lids are normal. Pupils are equal, round, and reactive to light. Lids are everted and swept, no foreign bodies found. Right eye exhibits no discharge. Left eye exhibits no chemosis, no discharge, no exudate and no hordeolum. No foreign body present in the left eye. Left conjunctiva is injected. Left conjunctiva has no hemorrhage. No scleral icterus.  Increased uptake of fluoroscein dye slightly medial of the iris.    Neck: Normal range of motion.  Cardiovascular: Normal rate, regular rhythm, normal heart sounds and intact distal  pulses.  Exam reveals no gallop and no friction rub.   No murmur heard. Pulmonary/Chest: Effort normal and breath sounds normal. No respiratory distress. She has no wheezes. She has no rales. She exhibits no tenderness.  Neurological: She is alert and oriented to person, place, and time.  Skin: Skin is warm and dry. She is not diaphoretic.  Psychiatric: She has a normal mood and affect. Her behavior is normal. Judgment and thought content normal.    ED Course  Procedures (including critical care time) DIAGNOSTIC STUDIES: Oxygen Saturation is 98% on room air, normal by my  interpretation.    COORDINATION OF CARE:  3:29 PM Discussed course of care with pt which includes an eye exam. I will give her a referral to follow up with an eye specialist within the next 48 hours. Pt understands and agrees.   Labs Review Labs Reviewed - No data to display  Imaging Review No results found.   EKG Interpretation None      MDM   Final diagnoses:  Corneal abrasion, left, initial encounter   Patient is a 28 y.o. Female who presents to the ED with left eye redness after being poked in the eye.  Patient has small corneal abrasion without obvious ulceration or discharge.  Patient will be given erythromycin ointment and referral to Dr. Dione Booze for opthalmology follow-up.  Patient was told to return for worsening vision, inability to move the eye.  She states understanding and agreement to the above plan.  Patient is stable for discharge at this time.  I personally performed the services described in this documentation, which was scribed in my presence. The recorded information has been reviewed and is accurate.    Eben Burow, PA-C 09/25/13 1611

## 2013-09-25 NOTE — Discharge Instructions (Signed)
Corneal Abrasion °The cornea is the clear covering at the front and center of the eye. When looking at the colored portion of the eye (iris), you are looking through the cornea. This very thin tissue is made up of many layers. The surface layer is a single layer of cells (corneal epithelium) and is one of the most sensitive tissues in the body. If a scratch or injury causes the corneal epithelium to come off, it is called a corneal abrasion. If the injury extends to the tissues below the epithelium, the condition is called a corneal ulcer. °CAUSES  °· Scratches. °· Trauma. °· Foreign body in the eye. °Some people have recurrences of abrasions in the area of the original injury even after it has healed (recurrent erosion syndrome). Recurrent erosion syndrome generally improves and goes away with time. °SYMPTOMS  °· Eye pain. °· Difficulty or inability to keep the injured eye open. °· The eye becomes very sensitive to light. °· Recurrent erosions tend to happen suddenly, first thing in the morning, usually after waking up and opening the eye. °DIAGNOSIS  °Your health care provider can diagnose a corneal abrasion during an eye exam. Dye is usually placed in the eye using a drop or a small paper strip moistened by your tears. When the eye is examined with a special light, the abrasion shows up clearly because of the dye. °TREATMENT  °· Small abrasions may be treated with antibiotic drops or ointment alone. °· A pressure patch may be put over the eye. If this is done, follow your doctor's instructions for when to remove the patch. Do not drive or use machines while the eye patch is on. Judging distances is hard to do with a patch on. °If the abrasion becomes infected and spreads to the deeper tissues of the cornea, a corneal ulcer can result. This is serious because it can cause corneal scarring. Corneal scars interfere with light passing through the cornea and cause a loss of vision in the involved eye. °HOME CARE  INSTRUCTIONS °· Use medicine or ointment as directed. Only take over-the-counter or prescription medicines for pain, discomfort, or fever as directed by your health care provider. °· Do not drive or operate machinery if your eye is patched. Your ability to judge distances is impaired. °· If your health care provider has given you a follow-up appointment, it is very important to keep that appointment. Not keeping the appointment could result in a severe eye infection or permanent loss of vision. If there is any problem keeping the appointment, let your health care provider know. °SEEK MEDICAL CARE IF:  °· You have pain, light sensitivity, and a scratchy feeling in one eye or both eyes. °· Your pressure patch keeps loosening up, and you can blink your eye under the patch after treatment. °· Any kind of discharge develops from the eye after treatment or if the lids stick together in the morning. °· You have the same symptoms in the morning as you did with the original abrasion days, weeks, or months after the abrasion healed. °MAKE SURE YOU:  °· Understand these instructions. °· Will watch your condition. °· Will get help right away if you are not doing well or get worse. °Document Released: 02/11/2000 Document Revised: 02/18/2013 Document Reviewed: 10/21/2012 °ExitCare® Patient Information ©2015 [employer]. This information is not intended to replace advice given to you by your health care provider. Make sure you discuss any questions you have with your health care provider. ° °

## 2013-09-25 NOTE — ED Notes (Addendum)
Pt states she got poked in left eye last night by finger, states she can see eye is just irritated and watery.

## 2013-09-26 NOTE — ED Provider Notes (Signed)
Medical screening examination/treatment/procedure(s) were performed by non-physician practitioner and as supervising physician I was immediately available for consultation/collaboration.   EKG Interpretation None       Shaan Rhoads M Kateline Kinkade, MD 09/26/13 2050 

## 2013-11-04 LAB — OB RESULTS CONSOLE GC/CHLAMYDIA
CHLAMYDIA, DNA PROBE: NEGATIVE
GC PROBE AMP, GENITAL: NEGATIVE

## 2013-12-29 ENCOUNTER — Encounter (HOSPITAL_COMMUNITY): Payer: Self-pay | Admitting: Emergency Medicine

## 2013-12-30 ENCOUNTER — Inpatient Hospital Stay (HOSPITAL_COMMUNITY): Payer: Medicaid Other | Admitting: Anesthesiology

## 2013-12-30 ENCOUNTER — Inpatient Hospital Stay (HOSPITAL_COMMUNITY)
Admission: AD | Admit: 2013-12-30 | Discharge: 2014-01-01 | DRG: 775 | Disposition: A | Discharge status: Home / Self Care | Payer: Medicaid Other | Source: Ambulatory Visit | Attending: Obstetrics and Gynecology | Admitting: Obstetrics and Gynecology

## 2013-12-30 ENCOUNTER — Encounter (HOSPITAL_COMMUNITY): Payer: Self-pay | Admitting: *Deleted

## 2013-12-30 DIAGNOSIS — Z3A38 38 weeks gestation of pregnancy: Secondary | ICD-10-CM | POA: Diagnosis present

## 2013-12-30 DIAGNOSIS — Z87891 Personal history of nicotine dependence: Secondary | ICD-10-CM | POA: Diagnosis not present

## 2013-12-30 DIAGNOSIS — O0933 Supervision of pregnancy with insufficient antenatal care, third trimester: Secondary | ICD-10-CM

## 2013-12-30 DIAGNOSIS — O4292 Full-term premature rupture of membranes, unspecified as to length of time between rupture and onset of labor: Secondary | ICD-10-CM | POA: Diagnosis present

## 2013-12-30 DIAGNOSIS — O9902 Anemia complicating childbirth: Secondary | ICD-10-CM | POA: Diagnosis present

## 2013-12-30 HISTORY — DX: Unspecified abnormal cytological findings in specimens from vagina: R87.629

## 2013-12-30 LAB — CBC
HEMATOCRIT: 33.3 % — AB (ref 36.0–46.0)
Hemoglobin: 10.9 g/dL — ABNORMAL LOW (ref 12.0–15.0)
MCH: 27 pg (ref 26.0–34.0)
MCHC: 32.7 g/dL (ref 30.0–36.0)
MCV: 82.4 fL (ref 78.0–100.0)
PLATELETS: 242 10*3/uL (ref 150–400)
RBC: 4.04 MIL/uL (ref 3.87–5.11)
RDW: 14.5 % (ref 11.5–15.5)
WBC: 7.5 10*3/uL (ref 4.0–10.5)

## 2013-12-30 LAB — OB RESULTS CONSOLE GBS: GBS: NEGATIVE

## 2013-12-30 LAB — AMNISURE RUPTURE OF MEMBRANE (ROM) NOT AT ARMC: Amnisure ROM: POSITIVE

## 2013-12-30 LAB — RPR

## 2013-12-30 LAB — TYPE AND SCREEN
ABO/RH(D): O POS
Antibody Screen: NEGATIVE

## 2013-12-30 LAB — GROUP B STREP BY PCR: Group B strep by PCR: NEGATIVE

## 2013-12-30 LAB — ABO/RH: ABO/RH(D): O POS

## 2013-12-30 MED ORDER — SIMETHICONE 80 MG PO CHEW: 80.0000 mg | CHEWABLE_TABLET | ORAL | Status: DC | PRN

## 2013-12-30 MED ORDER — FENTANYL 2.5 MCG/ML BUPIVACAINE 1/10 % EPIDURAL INFUSION (WH - ANES)
14.0000 mL/h | INTRAMUSCULAR | Status: DC | PRN
  Administered 2013-12-30 (×2): 14 mL/h via EPIDURAL
  Filled 2013-12-30 (×2): qty 125

## 2013-12-30 MED ORDER — FLEET ENEMA 7-19 GM/118ML RE ENEM: 1.0000 | ENEMA | RECTAL | Status: DC | PRN

## 2013-12-30 MED ORDER — EPHEDRINE 5 MG/ML INJ
10.0000 mg | INTRAVENOUS | Status: DC | PRN
  Filled 2013-12-30: qty 2

## 2013-12-30 MED ORDER — MEDROXYPROGESTERONE ACETATE 150 MG/ML IM SUSP: 150.0000 mg | INTRAMUSCULAR | Status: DC | PRN

## 2013-12-30 MED ORDER — LACTATED RINGERS IV SOLN: 500.0000 mL | INTRAVENOUS | Status: DC | PRN

## 2013-12-30 MED ORDER — ACETAMINOPHEN 325 MG PO TABS: 650.0000 mg | ORAL_TABLET | ORAL | Status: DC | PRN

## 2013-12-30 MED ORDER — FENTANYL 2.5 MCG/ML BUPIVACAINE 1/10 % EPIDURAL INFUSION (WH - ANES)
INTRAMUSCULAR | Status: DC | PRN
Start: 2013-12-30 — End: 2013-12-30
  Administered 2013-12-30: 14 mL/h via EPIDURAL

## 2013-12-30 MED ORDER — NALBUPHINE HCL 10 MG/ML IJ SOLN: 5.0000 mg | INTRAMUSCULAR | Status: DC | PRN

## 2013-12-30 MED ORDER — DIPHENHYDRAMINE HCL 50 MG/ML IJ SOLN: 12.5000 mg | INTRAMUSCULAR | Status: DC | PRN

## 2013-12-30 MED ORDER — OXYCODONE-ACETAMINOPHEN 5-325 MG PO TABS: 2.0000 | ORAL_TABLET | ORAL | Status: DC | PRN

## 2013-12-30 MED ORDER — ONDANSETRON HCL 4 MG PO TABS: 4.0000 mg | ORAL_TABLET | ORAL | Status: DC | PRN

## 2013-12-30 MED ORDER — OXYCODONE-ACETAMINOPHEN 5-325 MG PO TABS
2.0000 | ORAL_TABLET | ORAL | Status: DC | PRN
Start: 2013-12-30 — End: 2013-12-30

## 2013-12-30 MED ORDER — CITRIC ACID-SODIUM CITRATE 334-500 MG/5ML PO SOLN: 30.0000 mL | ORAL | Status: DC | PRN

## 2013-12-30 MED ORDER — TETANUS-DIPHTH-ACELL PERTUSSIS 5-2.5-18.5 LF-MCG/0.5 IM SUSP
0.5000 mL | Freq: Once | INTRAMUSCULAR | Status: AC
  Administered 2013-12-31: 0.5 mL via INTRAMUSCULAR

## 2013-12-30 MED ORDER — OXYCODONE-ACETAMINOPHEN 5-325 MG PO TABS: 1.0000 | ORAL_TABLET | ORAL | Status: DC | PRN

## 2013-12-30 MED ORDER — OXYTOCIN 40 UNITS IN LACTATED RINGERS INFUSION - SIMPLE MED: 62.5000 mL/h | INTRAVENOUS | Status: DC

## 2013-12-30 MED ORDER — LIDOCAINE HCL (PF) 1 % IJ SOLN
30.0000 mL | INTRAMUSCULAR | Status: DC | PRN
  Filled 2013-12-30: qty 30

## 2013-12-30 MED ORDER — IBUPROFEN 600 MG PO TABS
600.0000 mg | ORAL_TABLET | Freq: Four times a day (QID) | ORAL | Status: DC
  Administered 2013-12-30 – 2014-01-01 (×7): 600 mg via ORAL
  Filled 2013-12-30 (×7): qty 1

## 2013-12-30 MED ORDER — LACTATED RINGERS IV SOLN
500.0000 mL | Freq: Once | INTRAVENOUS | Status: AC
  Administered 2013-12-30: 500 mL via INTRAVENOUS

## 2013-12-30 MED ORDER — OXYTOCIN 40 UNITS IN LACTATED RINGERS INFUSION - SIMPLE MED
1.0000 m[IU]/min | INTRAVENOUS | Status: DC
  Administered 2013-12-30: 666 m[IU]/min via INTRAVENOUS
  Administered 2013-12-30 (×2): 2 m[IU]/min via INTRAVENOUS
  Filled 2013-12-30: qty 1000

## 2013-12-30 MED ORDER — ZOLPIDEM TARTRATE 5 MG PO TABS: 5.0000 mg | ORAL_TABLET | Freq: Every evening | ORAL | Status: DC | PRN

## 2013-12-30 MED ORDER — PRENATAL MULTIVITAMIN CH
1.0000 | ORAL_TABLET | Freq: Every day | ORAL | Status: DC
  Administered 2013-12-31 – 2014-01-01 (×2): 1 via ORAL
  Filled 2013-12-30 (×2): qty 1

## 2013-12-30 MED ORDER — LANOLIN HYDROUS EX OINT: TOPICAL_OINTMENT | CUTANEOUS | Status: DC | PRN

## 2013-12-30 MED ORDER — ONDANSETRON HCL 4 MG/2ML IJ SOLN
4.0000 mg | INTRAMUSCULAR | Status: DC | PRN
Start: 2013-12-30 — End: 2014-01-01

## 2013-12-30 MED ORDER — LACTATED RINGERS IV SOLN
INTRAVENOUS | Status: DC
  Administered 2013-12-30: 09:00:00 via INTRAUTERINE

## 2013-12-30 MED ORDER — OXYCODONE-ACETAMINOPHEN 5-325 MG PO TABS
1.0000 | ORAL_TABLET | ORAL | Status: DC | PRN
Start: 2013-12-30 — End: 2014-01-01
  Administered 2013-12-30 – 2014-01-01 (×2): 1 via ORAL
  Filled 2013-12-30 (×2): qty 1

## 2013-12-30 MED ORDER — FERROUS SULFATE 325 (65 FE) MG PO TABS
325.0000 mg | ORAL_TABLET | Freq: Two times a day (BID) | ORAL | Status: DC
  Administered 2013-12-31 – 2014-01-01 (×3): 325 mg via ORAL
  Filled 2013-12-30 (×3): qty 1

## 2013-12-30 MED ORDER — OXYTOCIN BOLUS FROM INFUSION: 500.0000 mL | INTRAVENOUS | Status: DC

## 2013-12-30 MED ORDER — LACTATED RINGERS IV SOLN
INTRAVENOUS | Status: DC
  Administered 2013-12-30 (×3): via INTRAVENOUS

## 2013-12-30 MED ORDER — DIBUCAINE 1 % RE OINT: 1.0000 "application " | TOPICAL_OINTMENT | RECTAL | Status: DC | PRN

## 2013-12-30 MED ORDER — TERBUTALINE SULFATE 1 MG/ML IJ SOLN: 0.2500 mg | Freq: Once | INTRAMUSCULAR | Status: DC | PRN

## 2013-12-30 MED ORDER — WITCH HAZEL-GLYCERIN EX PADS: 1.0000 "application " | MEDICATED_PAD | CUTANEOUS | Status: DC | PRN

## 2013-12-30 MED ORDER — BENZOCAINE-MENTHOL 20-0.5 % EX AERO
1.0000 "application " | INHALATION_SPRAY | CUTANEOUS | Status: DC | PRN
  Administered 2013-12-30: 1 via TOPICAL
  Filled 2013-12-30: qty 56

## 2013-12-30 MED ORDER — PHENYLEPHRINE 40 MCG/ML (10ML) SYRINGE FOR IV PUSH (FOR BLOOD PRESSURE SUPPORT)
80.0000 ug | PREFILLED_SYRINGE | INTRAVENOUS | Status: DC | PRN
  Filled 2013-12-30: qty 2
  Filled 2013-12-30: qty 10

## 2013-12-30 MED ORDER — LIDOCAINE HCL (PF) 1 % IJ SOLN
INTRAMUSCULAR | Status: DC | PRN
  Administered 2013-12-30 (×2): 8 mL

## 2013-12-30 MED ORDER — PHENYLEPHRINE 40 MCG/ML (10ML) SYRINGE FOR IV PUSH (FOR BLOOD PRESSURE SUPPORT)
80.0000 ug | PREFILLED_SYRINGE | INTRAVENOUS | Status: DC | PRN
  Filled 2013-12-30: qty 2

## 2013-12-30 MED ORDER — SENNOSIDES-DOCUSATE SODIUM 8.6-50 MG PO TABS
2.0000 | ORAL_TABLET | ORAL | Status: DC
  Administered 2013-12-30 – 2013-12-31 (×2): 2 via ORAL
  Filled 2013-12-30: qty 1
  Filled 2013-12-30 (×2): qty 2

## 2013-12-30 MED ORDER — DIPHENHYDRAMINE HCL 25 MG PO CAPS: 25.0000 mg | ORAL_CAPSULE | Freq: Four times a day (QID) | ORAL | Status: DC | PRN

## 2013-12-30 MED ORDER — ONDANSETRON HCL 4 MG/2ML IJ SOLN: 4.0000 mg | Freq: Four times a day (QID) | INTRAMUSCULAR | Status: DC | PRN

## 2013-12-30 NOTE — Progress Notes (Addendum)
  Subjective: Sleeping, yet easily aroused. Comfortable w/ epidural for the most part; some discomfort on left. FOB asleep.  Objective: BP 107/70 mmHg  Pulse 86  Temp(Src) 97.9 F (36.6 C) (Oral)  Resp 18  Ht 5' (1.524 m)  Wt 165 lb (74.844 kg)  BMI 32.22 kg/m2  SpO2 100%  LMP 04/07/2013    FHT: 140 w/ mod variability, +accels, mild, intermittent variables less than 30 secs with gradual return to baseline UC:   irregular, every 3-5 minutes SVE:  4/90/-2 IUPC placed w/o difficulty  Results for orders placed or performed during the hospital encounter of 12/30/13 (from the past 24 hour(s))  Amnisure rupture of membrane (rom)     Status: None   Collection Time: 12/30/13  1:25 AM  Result Value Ref Range   Amnisure ROM POSITIVE   CBC     Status: Abnormal   Collection Time: 12/30/13  2:30 AM  Result Value Ref Range   WBC 7.5 4.0 - 10.5 K/uL   RBC 4.04 3.87 - 5.11 MIL/uL   Hemoglobin 10.9 (L) 12.0 - 15.0 g/dL   HCT 16.133.3 (L) 09.636.0 - 04.546.0 %   MCV 82.4 78.0 - 100.0 fL   MCH 27.0 26.0 - 34.0 pg   MCHC 32.7 30.0 - 36.0 g/dL   RDW 40.914.5 81.111.5 - 91.415.5 %   Platelets 242 150 - 400 K/uL  Type and screen     Status: None   Collection Time: 12/30/13  2:30 AM  Result Value Ref Range   ABO/RH(D) O POS    Antibody Screen NEG    Sample Expiration 01/02/2014   ABO/Rh     Status: None   Collection Time: 12/30/13  2:30 AM  Result Value Ref Range   ABO/RH(D) O POS   Group B strep by PCR     Status: None   Collection Time: 12/30/13  2:40 AM  Result Value Ref Range   Group B strep by PCR NEGATIVE NEGATIVE    Assessment:  IUP at 38.1 wks Early labor Cat 2 FHRT that resolved to Cat 1 w/ position change GBS neg  Plan: Begin Pitocin augmentation. Consult as indicated Anticipate progress and SVD    Sherre ScarletWILLIAMS, Mayeli Bornhorst CNM 12/30/2013, 6:54 AM

## 2013-12-30 NOTE — Progress Notes (Addendum)
Labor Progress  Subjective: Feeling more pressure  Objective: BP 116/71 mmHg  Pulse 88  Temp(Src) 98.8 F (37.1 C) (Oral)  Resp 18  Ht 5' (1.524 m)  Wt 74.844 kg (165 lb)  BMI 32.22 kg/m2  SpO2 100%  LMP 04/07/2013   Total I/O In: -  Out: 425 [Urine:425]   FHT: 130, moderate variability, + accel, occasional variabile and early decel CTX:  regular, every 3 minutes Uterus gravid, soft non tender SVE:  7-8/100/0   Assessment:  IUP at 38.1 weeks NICHD: Category 2 Membranes:  SROM x 11hrs, no s/s of infection Labor progress: adequate progress MVUs 160 [id number]:negative Fetus does not tolerate position changes well    Plan: Continue labor plan Continuous monitoring Rest Frequent position changes to facilitate fetal rotation and descent. Will reassess with cervical exam at 1530 or earlier if necessary Restart pitocin when possible Peanut call for syms position    Kritika Stukes, CNM, MSN 12/30/2013. 1:21 PM

## 2013-12-30 NOTE — MAU Note (Signed)
Pt in room, pt amb to BR. Labor nurse in room.

## 2013-12-30 NOTE — Progress Notes (Addendum)
Labor Progress  Subjective: Pt sleeping, fob on the sofa.  Pt report the epidural is managing the pain.  Primary care nurse report pitocin was started at 0730 today.    Objective: BP 117/74 mmHg  Pulse 70  Temp(Src) 98.5 F (36.9 C) (Oral)  Resp 18  Ht 5' (1.524 m)  Wt 74.844 kg (165 lb)  BMI 32.22 kg/m2  SpO2 100%  LMP 04/07/2013    FHT: 135, moderate variability, + accel, decels started soon after pitocin was started CTX:  irregular, every 3-5 minutes Uterus gravid, soft non tender SVE:  Dilation: 5 Effacement (%): 90 Station: 0 Exam by:: Herma CarsonLindsay Lima rn  Pitocin 4mu Epidural   Assessment:  IUP at 38.1 weeks NICHD: Category 2 Membranes:  SROM x 8 hrs clear Labor progress: adquate labor progress MVUs 135 Pitocin Augmentation GBS: negative    Plan: Continue labor plan Continuous monitoring Rest Frequent position changes to facilitate fetal rotation and descent. Will reassess with cervical exam at 1200 or earlier if necessary Stop pitocin Reposition as needed to decrease the  decels Amnioinfusion with bolus of 300 cc, then 150 cc/hr.     Sanaai Doane, CNM, MSN 12/30/2013. 9:01 AM

## 2013-12-30 NOTE — Progress Notes (Signed)
Labor Progress  Subjective: Pt sleeping  Objective: BP 96/69 mmHg  Pulse 82  Temp(Src) 98.5 F (36.9 C) (Axillary)  Resp 18  Ht 5' (1.524 m)  Wt 74.844 kg (165 lb)  BMI 32.22 kg/m2  SpO2 100%  LMP 04/07/2013   Total I/O In: -  Out: 425 [Urine:425] FHT: 135, moderate variability, + accel, decels with lateral rotation CTX:  3 Uterus gravid, soft non tender SVE:  Dilation: 8.5 (thick anterior lip) Effacement (%): 90 Station: +1 Exam by:: Herma CarsonLindsay Lima, rn   Assessment:  IUP at 38.1 weeks NICHD: Category 2 Membranes: SROM x 15hrs, no s/s of infection Labor progress: stalled MVUs 160 Asynclitic position  Pitocin Augmentation GBS: negative  Plan: Continue labor plan Continuous monitoring Rest Frequent position changes to facilitate fetal rotation and descent. Will reassess with cervical exam at 1530 or earlier if necessary Continue pitocin per protocol    Kempton Milne, CNM, MSN 12/30/2013. 3:11 PM

## 2013-12-30 NOTE — Plan of Care (Signed)
Problem: Phase I Progression Outcomes Goal: Voiding adequately Outcome: Completed/Met Date Met:  12/30/13

## 2013-12-30 NOTE — Anesthesia Procedure Notes (Signed)
Epidural Patient location during procedure: OB Start time: 12/30/2013 4:42 AM End time: 12/30/2013 4:46 AM  Staffing Anesthesiologist: Leilani AbleHATCHETT, Brennyn Haisley  Preanesthetic Checklist Completed: patient identified, surgical consent, pre-op evaluation, timeout performed, IV checked, risks and benefits discussed and monitors and equipment checked  Epidural Patient position: sitting Prep: site prepped and draped and DuraPrep Patient monitoring: continuous pulse ox and blood pressure Approach: midline Location: L3-L4 Injection technique: LOR air  Needle:  Needle type: Tuohy  Needle gauge: 17 G Needle length: 9 cm and 9 Needle insertion depth: 4 cm Catheter type: closed end flexible Catheter size: 19 Gauge Catheter at skin depth: 9 cm Test dose: negative and Other  Assessment Sensory level: T9 Events: blood not aspirated, injection not painful, no injection resistance, negative IV test and no paresthesia

## 2013-12-30 NOTE — MAU Note (Signed)
[facility]risty[facility] May transfer to room [facility]. Strip reviewed by Neysa Bonitohristy, RN.
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
## 2013-12-30 NOTE — Plan of Care (Signed)
Problem: Phase I Progression Outcomes Goal: Foley catheter patent Outcome: Not Applicable Date Met:  38/38/18

## 2013-12-30 NOTE — Anesthesia Preprocedure Evaluation (Signed)
Anesthesia Evaluation  Patient identified by MRN, date of birth, ID band Patient awake    Reviewed: Allergy & Precautions, H&P , NPO status , Patient's Chart, lab work & pertinent test results  Airway Mallampati: II  TM Distance: >3 FB Neck ROM: full    Dental no notable dental hx.    Pulmonary neg pulmonary ROS, former smoker,    Pulmonary exam normal       Cardiovascular negative cardio ROS      Neuro/Psych negative neurological ROS  negative psych ROS   GI/Hepatic negative GI ROS, Neg liver ROS,   Endo/Other  negative endocrine ROS  Renal/GU negative Renal ROS     Musculoskeletal   Abdominal Normal abdominal exam  (+)   Peds  Hematology negative hematology ROS (+)   Anesthesia Other Findings   Reproductive/Obstetrics (+) Pregnancy                             Anesthesia Physical Anesthesia Plan  ASA: II  Anesthesia Plan: Epidural   Post-op Pain Management:    Induction:   Airway Management Planned:   Additional Equipment:   Intra-op Plan:   Post-operative Plan:   Informed Consent: I have reviewed the patients History and Physical, chart, labs and discussed the procedure including the risks, benefits and alternatives for the proposed anesthesia with the patient or authorized representative who has indicated his/her understanding and acceptance.     Plan Discussed with:   Anesthesia Plan Comments:         Anesthesia Quick Evaluation

## 2013-12-31 LAB — CBC
HCT: 28.3 % — ABNORMAL LOW (ref 36.0–46.0)
Hemoglobin: 9.5 g/dL — ABNORMAL LOW (ref 12.0–15.0)
MCH: 27.4 pg (ref 26.0–34.0)
MCHC: 33.6 g/dL (ref 30.0–36.0)
MCV: 81.6 fL (ref 78.0–100.0)
Platelets: 193 10*3/uL (ref 150–400)
RBC: 3.47 MIL/uL — AB (ref 3.87–5.11)
RDW: 14.6 % (ref 11.5–15.5)
WBC: 13.2 10*3/uL — AB (ref 4.0–10.5)

## 2013-12-31 MED ORDER — MEASLES, MUMPS & RUBELLA VAC ~~LOC~~ INJ
0.5000 mL | INJECTION | Freq: Once | SUBCUTANEOUS | Status: DC
  Filled 2013-12-31 (×2): qty 0.5

## 2013-12-31 NOTE — Plan of Care (Signed)
Problem: Consults Goal: Skin Care Protocol Initiated - if Braden Score 18 or less If consults are not indicated, leave blank or document N/A  Outcome: Not Applicable Date Met:  41/66/06 Goal: Nutrition Consult-if indicated Outcome: Not Applicable Date Met:  30/16/01 Goal: Diabetes Guidelines if Diabetic/Glucose > 140 If diabetic or lab glucose is > 140 mg/dl - Initiate Diabetes/Hyperglycemia Guidelines & Document Interventions  Outcome: Not Applicable Date Met:  09/32/35  Problem: Discharge Progression Outcomes Goal: Remove staples per MD order Outcome: Not Applicable Date Met:  57/32/20

## 2013-12-31 NOTE — Anesthesia Postprocedure Evaluation (Signed)
  Anesthesia Post-op Note  Anesthesia Post Note  Patient: Erin Johnston  Procedure(s) Performed: * No procedures listed *  Anesthesia type: Epidural  Patient location: Mother/Baby  Post pain: Pain level controlled  Post assessment: Post-op Vital signs reviewed  Last Vitals:  Filed Vitals:   12/30/13 2300  BP: 125/77  Pulse: 86  Temp: 36.7 C  Resp: 20    Post vital signs: Reviewed  Level of consciousness:alert  Complications: No apparent anesthesia complications

## 2013-12-31 NOTE — Progress Notes (Addendum)
Subjective: Postpartum Day 1: Vaginal delivery, bilateral periurethral lacerations Patient up ad lib, reports no syncope or dizziness.  Very sleepy at present--just took pain med. Feeding:  Bottle Contraceptive plan:  Undecided  Objective: Vital signs in last 24 hours: Temp:  [98.1 F (36.7 C)-98.8 F (37.1 C)] 98.1 F (36.7 C) (11/03 2300) Pulse Rate:  [70-156] 86 (11/03 2300) Resp:  [16-20] 20 (11/03 2300) BP: (96-138)/(56-95) 125/77 mmHg (11/03 2300) SpO2:  [100 %] 100 % (11/03 2300)   Orthostatics stable  Physical Exam:  General: alert Lochia: appropriate Uterine Fundus: firm Perineum: healing well DVT Evaluation: No evidence of DVT seen on physical exam. Negative Homan's sign.    Recent Labs  12/30/13 0230 12/31/13 0557  HGB 10.9* 9.5*  HCT 33.3* 28.3*    Assessment/Plan: Status post vaginal delivery day 1. Mild anemia, without hemodynamic instability Rubella non-immune--MMR ordered Stable Continue current care. On Fe BID Plan for discharge tomorrow    Allena Katz 12/31/2013, 8:08 AM

## 2013-12-31 NOTE — Progress Notes (Signed)
Clinical Social Work Department PSYCHOSOCIAL ASSESSMENT - MATERNAL/CHILD 12/31/2013  Patient:  Erin Johnston, Erin Johnston  Account Number:  [id number]  Admit Date:  12/30/2013  Ardine Eng Name:   Erin Johnston   Clinical Social Worker:  Lucita Ferrara, CLINICAL SOCIAL WORKER   Date/Time:  12/31/2013 11:00 AM  Date Referred:  12/30/2013   Referral source  Central Nursery     Referred reason  [facility]   Other referral source:    I:  FAMILY / HOME ENVIRONMENT Child's legal guardian:  PARENT  Guardian - Name Bouton - Age [place] [address], [place] [address]  Cristy Friedlander  different residence   Other household support members/support persons Name Relationship DOB   MOTHER    DAUGHTER 16 yeras old   DAUGHTER 26 years old   Other support:   Erin Johnston identified her mother as her primary support person. She stated that she currently lives with her mother and appreciates her help/support as she balances work and family responsibility.  She stated that she will be co-parenting with the FOB as they are not in a romantic relationship.  Erin Johnston requested that he not sign the birth certificate, but did not clarify why.     II  PSYCHOSOCIAL DATA Information Source:  Patient Interview  Financial and Community Resources Employment:   Erin Johnston stated that she works at an [employer] and feels well supported by her employer. She stated that her coworkers had a baby shower for her.    Financial resources:  Medicaid If Medicaid - County:  [place] Other  [place] / Grade:  N/A Music therapist / Child Services Coordination / Early Interventions:   None reported  Cultural issues impacting care:   None reported.    III  STRENGTHS Strengths  Adequate Resources  Home prepared for Child (including basic supplies)  Supportive family/friends   Strength comment:    IV  RISK FACTORS AND CURRENT PROBLEMS Current Problem:  YES   Risk Factor &  Current Problem Patient Issue Family Issue Risk Factor / Current Problem Comment  Other - See comment Y N Erin Johnston presents with limited prenatal care as she only had two prenatal appointments.  Baby's UDS is negative.    V  SOCIAL WORK ASSESSMENT CSW met with the Erin Johnston in her room in order to complete the assessment. The FOB and the Erin Johnston's brother were present when CSW entered, but Erin Johnston requested that they leave in order for the assessment to be completed.  Erin Johnston was easily engaged and was receptive to the visit.  She presented as attentive and bonding with the baby.  She displayed a full range in affect, presented in a pleasant mood, and expressed appreciation for the visit.   CSW assisted the Erin Johnston process her thoughts and feelings as she transitions into the postpartum period.  The CSW frequently validated and normalized her feelings as she discussed recent work-related stress.  Erin Johnston stated that she needed to reach a milestone in days of employment in order to obtain FMLA.  Erin Johnston expressed stress since she thought she may have the baby early which would have impaired her ability to receive FMLA.  Erin Johnston discussed that throughout the pregnancy she felt need to hide it out of fear of being fired, and she stated that she wanted to maintain her employment since she believes it is a good job with good benefits.  Erin Johnston stated that now that she has secured FMLA and  knows that her job is safe, she feels relieved.  Erin Johnston also reflected upon previous stress of needing "to prove self" to her employer which resulted in her working 6-7 days a week for long hours. CSW inquired about ability to cope with the stress.  Erin Johnston stated it is helpful for her to talk to her mother. Erin Johnston identified her work as her primary stressor during her pregnancy.    Erin Johnston acknowledged that her work schedule is the reason why she had limited prenatal care.  Erin Johnston denied any other barriers to obtaining care.  Erin Johnston stated that now that she has secured her job, that there  will be no difficulties with her attending any follow-up appointments. Erin Johnston acknowledged hospital drug screen policy due to [facility].  Erin Johnston denied any concerns related to the policy and denied any substance use during her pregnancy.  No barriers to discharge.     VI SOCIAL WORK PLAN Social Work Therapist, art  No Further Intervention Required / No Barriers to Discharge   Type of pt/family education:   Postpartum depression  Hospital drug screen policy   If child protective services report - county:   If child protective services report - date:   Information/referral to community resources comment:   No referrals needed at this time.   Other social work plan:   CSW to monitor meconium drug screen and will make CPS report if needed.  CSW to follow up PRN.

## 2013-12-31 NOTE — Discharge Summary (Signed)
Vaginal Delivery Discharge Summary  ALL information will be verified prior to discharge  Erin Johnston  DOB:    29-Mar-1985 MRN:    [id number] CSN:    [id number]  Date of admission:                  12/30/13  Date of discharge:                   01/01/14  Procedures this admission: SVD by Kynzi Levay CNM  Date of Delivery: 12/30/13  Newborn Data:  Live born  Information for the patient's newborn:  Pruiett, Girl Sabree [[id number]]  female  Live born female  Birth Weight: 7 lb 11.1 oz (3490 g) APGAR: 8, 9  Home with mother. Name: November  History of Present Illness: Ms. HANG AMMON is a 28 y.o. female, [id number], who presents at [redacted]w[redacted]d weeks gestation. The patient has been followed at the [facility] Gynecology division of [facility]. She was admitted onset of labor. Her pregnancy has been complicated by:  Patient Active Problem List   Diagnosis Date Noted  . Insufficient prenatal care 12/30/2013  . Vaginal delivery 12/30/2013  . Rubella non-immune status, antepartum 12/30/2013    Hospital course: The patient was admitted for labor.   Her labor was not complicated. She proceeded to have a vaginal delivery of a healthy infant. Her delivery was not complicated. Her postpartum course was not complicated. She was discharged to home on postpartum day 2 doing well.  Feeding: breast  Contraception: unsure Pt understands the risks birth control are not limited to irregular bleeding, formation of DVT, fluid fluctuations, elevation in blood pressure, stroke, breast tenderness and liver damage.  She states she will report any serious side effects.  She has been given verbal and written instructions and voiced a clear understanding.    Discharge hemoglobin: HEMOGLOBIN  Date Value Ref Range Status  12/31/2013 9.5* 12.0 - 15.0 g/dL Final   HCT  Date Value Ref Range Status  12/31/2013 28.3* 36.0 - 46.0 % Final  anemia - hemodynamiclly  stable  PreNatal Labs ABO, Rh: --/--/O POS, O POS (11/03 0230)   Antibody: NEG (11/03 0230) Rubella:   non immune      RPR: NON REAC (11/03 0230)  HBsAg: Negative (07/24 0000)  HIV: Non-reactive (07/24 0000)  GBS: Negative (11/03 0000)  Discharge Physical Exam:  General: alert and cooperative Lochia: appropriate Uterine Fundus: firm Incision: healing well DVT Evaluation: No evidence of DVT seen on physical exam.  Intrapartum Procedures: spontaneous vaginal delivery Postpartum Procedures: Rubella Ig to be given prior to discharge Complications-Operative and Postpartum: superficial bilateral periurethral  degree perineal laceration, repaired  Discharge Diagnoses: Term Pregnancy-delivered and PROM x15 hours  Activity:           pelvic rest Diet:                routine Medications: PNV, Ibuprofen, Iron and Percocet Condition:      stable     Postpartum Teaching: Nutrition, exercise, return to work or school, family visits, sexual activity, home rest, vaginal bleeding, pelvic rest, family planning, s/s of PPD, breast care peri-care and incision care  Cleared for DC by social work  Discharge to: home  Follow-up Information    Follow up with [facility]. Schedule an appointment as soon as possible for a visit in 6 weeks.   Specialty:  Obstetrics and Gynecology   Why:  For a 6 week postpartum visit   Contact information:   [address] [place] [address] [phone]       Willadene Mounsey, CNM, MSN 12/31/2013. 10:14 PM   Postpartum Care After Vaginal Delivery  After you deliver your newborn (postpartum period), the usual stay in the hospital is 24 72 hours. If there were problems with your labor or delivery, or if you have other medical problems, you might be in the hospital longer.  While you are in the hospital, you will receive help and instructions on how to care for yourself and your newborn during the  postpartum period.  While you are in the hospital:  Be sure to tell your nurses if you have pain or discomfort, as well as where you feel the pain and what makes the pain worse.  If you had an incision made near your vagina (episiotomy) or if you had some tearing during delivery, the nurses may put ice packs on your episiotomy or tear. The ice packs may help to reduce the pain and swelling.  If you are breastfeeding, you may feel uncomfortable contractions of your uterus for a couple of weeks. This is normal. The contractions help your uterus get back to normal size.  It is normal to have some bleeding after delivery.  For the first 1 3 days after delivery, the flow is red and the amount may be similar to a period.  It is common for the flow to start and stop.  In the first few days, you may pass some small clots. Let your nurses know if you begin to pass large clots or your flow increases.  Do not  flush blood clots down the toilet before having the nurse look at them.  During the next 3 10 days after delivery, your flow should become more watery and pink or brown-tinged in color.  Ten to fourteen days after delivery, your flow should be a small amount of yellowish-white discharge.  The amount of your flow will decrease over the first few weeks after delivery. Your flow may stop in 6 8 weeks. Most women have had their flow stop by 12 weeks after delivery.  You should change your sanitary pads frequently.  Wash your hands thoroughly with soap and water for at least 20 seconds after changing pads, using the toilet, or before holding or feeding your newborn.  You should feel like you need to empty your bladder within the first 6 8 hours after delivery.  In case you become weak, lightheaded, or faint, call your nurse before you get out of bed for the first time and before you take a shower for the first time.  Within the first few days after delivery, your breasts may begin to feel  tender and full. This is called engorgement. Breast tenderness usually goes away within 48 72 hours after engorgement occurs. You may also notice milk leaking from your breasts. If you are not breastfeeding, do not stimulate your breasts. Breast stimulation can make your breasts produce more milk.  Spending as much time as possible with your newborn is very important. During this time, you and your newborn can feel close and get to know each other. Having your newborn stay in your room (rooming in) will help to strengthen the bond with your newborn. It will give you time to get to know your newborn and become comfortable caring for your newborn.  Your hormones change after delivery. Sometimes the hormone changes can  temporarily cause you to feel sad or tearful. These feelings should not last more than a few days. If these feelings last longer than that, you should talk to your caregiver.  If desired, talk to your caregiver about methods of family planning or contraception.  Talk to your caregiver about immunizations. Your caregiver may want you to have the following immunizations before leaving the hospital:  Tetanus, diphtheria, and pertussis (Tdap) or tetanus and diphtheria (Td) immunization. It is very important that you and your family (including grandparents) or others caring for your newborn are up-to-date with the Tdap or Td immunizations. The Tdap or Td immunization can help protect your newborn from getting ill.  Rubella immunization.  Varicella (chickenpox) immunization.  Influenza immunization. You should receive this annual immunization if you did not receive the immunization during your pregnancy. Document Released: 12/11/2006 Document Revised: 11/08/2011 Document Reviewed: 10/11/2011 [facility] Patient Information 2014 [place], [place].   Postpartum Depression and Baby Blues  The postpartum period begins right after the birth of a baby. During this time, there is often a great  amount of joy and excitement. It is also a time of considerable changes in the life of the parent(s). Regardless of how many times a mother gives birth, each child brings new challenges and dynamics to the family. It is not unusual to have feelings of excitement accompanied by confusing shifts in moods, emotions, and thoughts. All mothers are at risk of developing postpartum depression or the "baby blues." These mood changes can occur right after giving birth, or they may occur many months after giving birth. The baby blues or postpartum depression can be mild or severe. Additionally, postpartum depression can resolve rather quickly, or it can be a long-term condition. CAUSES Elevated hormones and their rapid decline are thought to be a main cause of postpartum depression and the baby blues. There are a number of hormones that radically change during and after pregnancy. Estrogen and progesterone usually decrease immediately after delivering your baby. The level of thyroid hormone and various cortisol steroids also rapidly drop. Other factors that play a major role in these changes include major life events and genetics.  RISK FACTORS If you have any of the following risks for the baby blues or postpartum depression, know what symptoms to watch out for during the postpartum period. Risk factors that may increase the likelihood of getting the baby blues or postpartum depression include: 1. Havinga personal or family history of depression. 2. Having depression while being pregnant. 3. Having premenstrual or oral contraceptive-associated mood issues. 4. Having exceptional life stress. 5. Having marital conflict. 6. Lacking a social support network. 7. Having a baby with special needs. 8. Having health problems such as diabetes. SYMPTOMS Baby blues symptoms include:  Brief fluctuations in mood, such as going from extreme happiness to sadness.  Decreased concentration.  Difficulty sleeping.  Crying  spells, tearfulness.  Irritability.  Anxiety. Postpartum depression symptoms typically begin within the first month after giving birth. These symptoms include:  Difficulty sleeping or excessive sleepiness.  Marked weight loss.  Agitation.  Feelings of worthlessness.  Lack of interest in activity or food. Postpartum psychosis is a very concerning condition and can be dangerous. Fortunately, it is rare. Displaying any of the following symptoms is cause for immediate medical attention. Postpartum psychosis symptoms include:  Hallucinations and delusions.  Bizarre or disorganized behavior.  Confusion or disorientation. DIAGNOSIS  A diagnosis is made by an evaluation of your symptoms. There are no medical or lab tests  that lead to a diagnosis, but there are various questionnaires that a caregiver may use to identify those with the baby blues, postpartum depression, or psychosis. Often times, a screening tool called the [place] Postnatal Depression Scale is used to diagnose depression in the postpartum period.  TREATMENT The baby blues usually goes away on its own in 1 to 2 weeks. Social support is often all that is needed. You should be encouraged to get adequate sleep and rest. Occasionally, you may be given medicines to help you sleep.  Postpartum depression requires treatment as it can last several months or longer if it is not treated. Treatment may include individual or group therapy, medicine, or both to address any social, physiological, and psychological factors that may play a role in the depression. Regular exercise, a healthy diet, rest, and social support may also be strongly recommended.  Postpartum psychosis is more serious and needs treatment right away. Hospitalization is often needed. HOME CARE INSTRUCTIONS  Get as much rest as you can. Nap when the baby sleeps.  Exercise regularly. Some women find yoga and walking to be beneficial.  Eat a balanced and nourishing  diet.  Do little things that you enjoy. Have a cup of tea, take a bubble bath, read your favorite magazine, or listen to your favorite music.  Avoid alcohol.  Ask for help with household chores, cooking, grocery shopping, or running errands as needed. Do not try to do everything.  Talk to people close to you about how you are feeling. Get support from your partner, family members, friends, or other new moms.  Try to stay positive in how you think. Think about the things you are grateful for.  Do not spend a lot of time alone.  Only take medicine as directed by your caregiver.  Keep all your postpartum appointments.  Let your caregiver know if you have any concerns. SEEK MEDICAL CARE IF: You are having a reaction or problems with your medicine. SEEK IMMEDIATE MEDICAL CARE IF:  You have suicidal feelings.  You feel you may harm the baby or someone else. Document Released: 11/18/2003 Document Revised: 05/08/2011 Document Reviewed: 12/20/2010 [facility] Patient Information 2014 [place].     Breastfeeding Deciding to breastfeed is one of the best choices you can make for you and your baby. A change in hormones during pregnancy causes your breast tissue to grow and increases the number and size of your milk ducts. These hormones also allow proteins, sugars, and fats from your blood supply to make breast milk in your milk-producing glands. Hormones prevent breast milk from being released before your baby is born as well as prompt milk flow after birth. Once breastfeeding has begun, thoughts of your baby, as well as his or her sucking or crying, can stimulate the release of milk from your milk-producing glands.  BENEFITS OF BREASTFEEDING For Your Baby  Your first milk (colostrum) helps your baby's digestive system function better.   There are antibodies in your milk that help your baby fight off infections.   Your baby has a lower incidence of asthma, allergies, and sudden  infant death syndrome.   The nutrients in breast milk are better for your baby than infant formulas and are designed uniquely for your baby's needs.   Breast milk improves your baby's brain development.   Your baby is less likely to develop other conditions, such as childhood obesity, asthma, or type 2 diabetes mellitus.  For You   Breastfeeding helps to create a very  special bond between you and your baby.   Breastfeeding is convenient. Breast milk is always available at the correct temperature and costs nothing.   Breastfeeding helps to burn calories and helps you lose the weight gained during pregnancy.   Breastfeeding makes your uterus contract to its prepregnancy size faster and slows bleeding (lochia) after you give birth.   Breastfeeding helps to lower your risk of developing type 2 diabetes mellitus, osteoporosis, and breast or ovarian cancer later in life. SIGNS THAT YOUR BABY IS HUNGRY Early Signs of Hunger  Increased alertness or activity.  Stretching.  Movement of the head from side to side.  Movement of the head and opening of the mouth when the corner of the mouth or cheek is stroked (rooting).  Increased sucking sounds, smacking lips, cooing, sighing, or squeaking.  Hand-to-mouth movements.  Increased sucking of fingers or hands. Late Signs of Hunger  Fussing.  Intermittent crying. Extreme Signs of Hunger Signs of extreme hunger will require calming and consoling before your baby will be able to breastfeed successfully. Do not wait for the following signs of extreme hunger to occur before you initiate breastfeeding:   Restlessness.  A loud, strong cry.   Screaming.   BREASTFEEDING BASICS Breastfeeding Initiation  Find a comfortable place to sit or lie down, with your neck and back well supported.  Place a pillow or rolled up blanket under your baby to bring him or her to the level of your breast (if you are seated). Nursing pillows are  specially designed to help support your arms and your baby while you breastfeed.  Make sure that your baby's abdomen is facing your abdomen.   Gently massage your breast. With your fingertips, massage from your chest wall toward your nipple in a circular motion. This encourages milk flow. You may need to continue this action during the feeding if your milk flows slowly.  Support your breast with 4 fingers underneath and your thumb above your nipple. Make sure your fingers are well away from your nipple and your baby's mouth.   Stroke your baby's lips gently with your finger or nipple.   When your baby's mouth is open wide enough, quickly bring your baby to your breast, placing your entire nipple and as much of the colored area around your nipple (areola) as possible into your baby's mouth.   More areola should be visible above your baby's upper lip than below the lower lip.   Your baby's tongue should be between his or her lower gum and your breast.   Ensure that your baby's mouth is correctly positioned around your nipple (latched). Your baby's lips should create a seal on your breast and be turned out (everted).  It is common for your baby to suck about 2-3 minutes in order to start the flow of breast milk. Latching Teaching your baby how to latch on to your breast properly is very important. An improper latch can cause nipple pain and decreased milk supply for you and poor weight gain in your baby. Also, if your baby is not latched onto your nipple properly, he or she may swallow some air during feeding. This can make your baby fussy. Burping your baby when you switch breasts during the feeding can help to get rid of the air. However, teaching your baby to latch on properly is still the best way to prevent fussiness from swallowing air while breastfeeding. Signs that your baby has successfully latched on to your nipple:  Silent tugging or silent sucking, without causing you pain.    Swallowing heard between every 3-4 sucks.    Muscle movement above and in front of his or her ears while sucking.  Signs that your baby has not successfully latched on to nipple:   Sucking sounds or smacking sounds from your baby while breastfeeding.  Nipple pain. If you think your baby has not latched on correctly, slip your finger into the corner of your baby's mouth to break the suction and place it between your baby's gums. Attempt breastfeeding initiation again. Signs of Successful Breastfeeding Signs from your baby:   A gradual decrease in the number of sucks or complete cessation of sucking.   Falling asleep.   Relaxation of his or her body.   Retention of a small amount of milk in his or her mouth.   Letting go of your breast by himself or herself. Signs from you:  Breasts that have increased in firmness, weight, and size 1-3 hours after feeding.   Breasts that are softer immediately after breastfeeding.  Increased milk volume, as well as a change in milk consistency and color by the fifth day of breastfeeding.   Nipples that are not sore, cracked, or bleeding. Signs That Your Pecola LeisureBaby is Getting Enough Milk  Wetting at least 3 diapers in a 24-hour period. The urine should be clear and pale yellow by age 26 days.  At least 3 stools in a 24-hour period by age 26 days. The stool should be soft and yellow.  At least 3 stools in a 24-hour period by age 173 days. The stool should be seedy and yellow.  No loss of weight greater than 10% of birth weight during the first 233 days of age.  Average weight gain of 4-7 ounces (113-198 g) per week after age 17 days.  Consistent daily weight gain by age 26 days, without weight loss after the age of 2 weeks. After a feeding, your baby may spit up a small amount. This is common. BREASTFEEDING FREQUENCY AND DURATION Frequent feeding will help you make more milk and can prevent sore nipples and breast engorgement. Breastfeed when  you feel the need to reduce the fullness of your breasts or when your baby shows signs of hunger. This is called "breastfeeding on demand." Avoid introducing a pacifier to your baby while you are working to establish breastfeeding (the first 4-6 weeks after your baby is born). After this time you may choose to use a pacifier. Research has shown that pacifier use during the first year of a baby's life decreases the risk of sudden infant death syndrome (SIDS). Allow your baby to feed on each breast as long as he or she wants. Breastfeed until your baby is finished feeding. When your baby unlatches or falls asleep while feeding from the first breast, offer the second breast. Because newborns are often sleepy in the first few weeks of life, you may need to awaken your baby to get him or her to feed. Breastfeeding times will vary from baby to baby. However, the following rules can serve as a guide to help you ensure that your baby is properly fed:  Newborns (babies 824 weeks of age or younger) may breastfeed every 1-3 hours.  Newborns should not go longer than 3 hours during the day or 5 hours during the night without breastfeeding.  You should breastfeed your baby a minimum of 8 times in a 24-hour period until you begin to introduce solid foods to  your baby at around 53 months of age. BREAST MILK PUMPING Pumping and storing breast milk allows you to ensure that your baby is exclusively fed your breast milk, even at times when you are unable to breastfeed. This is especially important if you are going back to work while you are still breastfeeding or when you are not able to be present during feedings. Your lactation consultant can give you guidelines on how long it is safe to store breast milk.  A breast pump is a machine that allows you to pump milk from your breast into a sterile bottle. The pumped breast milk can then be stored in a refrigerator or freezer. Some breast pumps are operated by hand, while others  use electricity. Ask your lactation consultant which type will work best for you. Breast pumps can be purchased, but some hospitals and breastfeeding support groups lease breast pumps on a monthly basis. A lactation consultant can teach you how to hand express breast milk, if you prefer not to use a pump.  CARING FOR YOUR BREASTS WHILE YOU BREASTFEED Nipples can become dry, cracked, and sore while breastfeeding. The following recommendations can help keep your breasts moisturized and healthy:  Avoid using soap on your nipples.   Wear a supportive bra. Although not required, special nursing bras and tank tops are designed to allow access to your breasts for breastfeeding without taking off your entire bra or top. Avoid wearing underwire-style bras or extremely tight bras.  Air dry your nipples for 3-29minutes after each feeding.   Use only cotton bra pads to absorb leaked breast milk. Leaking of breast milk between feedings is normal.   Use lanolin on your nipples after breastfeeding. Lanolin helps to maintain your skin's normal moisture barrier. If you use pure lanolin, you do not need to wash it off before feeding your baby again. Pure lanolin is not toxic to your baby. You may also hand express a few drops of breast milk and gently massage that milk into your nipples and allow the milk to air dry. In the first few weeks after giving birth, some women experience extremely full breasts (engorgement). Engorgement can make your breasts feel heavy, warm, and tender to the touch. Engorgement peaks within 3-5 days after you give birth. The following recommendations can help ease engorgement:  Completely empty your breasts while breastfeeding or pumping. You may want to start by applying warm, moist heat (in the shower or with warm water-soaked hand towels) just before feeding or pumping. This increases circulation and helps the milk flow. If your baby does not completely empty your breasts while  breastfeeding, pump any extra milk after he or she is finished.  Wear a snug bra (nursing or regular) or tank top for 1-2 days to signal your body to slightly decrease milk production.  Apply ice packs to your breasts, unless this is too uncomfortable for you.  Make sure that your baby is latched on and positioned properly while breastfeeding. If engorgement persists after 48 hours of following these recommendations, contact your health care provider or a Advertising copywriter. OVERALL HEALTH CARE RECOMMENDATIONS WHILE BREASTFEEDING  Eat healthy foods. Alternate between meals and snacks, eating 3 of each per day. Because what you eat affects your breast milk, some of the foods may make your baby more irritable than usual. Avoid eating these foods if you are sure that they are negatively affecting your baby.  Drink milk, fruit juice, and water to satisfy your thirst (about 10 glasses  a day).   Rest often, relax, and continue to take your prenatal vitamins to prevent fatigue, stress, and anemia.  Continue breast self-awareness checks.  Avoid chewing and smoking tobacco.  Avoid alcohol and drug use. Some medicines that may be harmful to your baby can pass through breast milk. It is important to ask your health care provider before taking any medicine, including all over-the-counter and prescription medicine as well as vitamin and herbal supplements. It is possible to become pregnant while breastfeeding. If birth control is desired, ask your health care provider about options that will be safe for your baby. SEEK MEDICAL CARE IF:   You feel like you want to stop breastfeeding or have become frustrated with breastfeeding.  You have painful breasts or nipples.  Your nipples are cracked or bleeding.  Your breasts are red, tender, or warm.  You have a swollen area on either breast.  You have a fever or chills.  You have nausea or vomiting.  You have drainage other than breast milk from  your nipples.  Your breasts do not become full before feedings by the fifth day after you give birth.  You feel sad and depressed.  Your baby is too sleepy to eat well.  Your baby is having trouble sleeping.   Your baby is wetting less than 3 diapers in a 24-hour period.  Your baby has less than 3 stools in a 24-hour period.  Your baby's skin or the white part of his or her eyes becomes yellow.   Your baby is not gaining weight by 27 days of age. SEEK IMMEDIATE MEDICAL CARE IF:   Your baby is overly tired (lethargic) and does not want to wake up and feed.  Your baby develops an unexplained fever. Document Released: 02/13/2005 Document Revised: 02/18/2013 Document Reviewed: 08/07/2012 [facility] Patient Information 2015 [place], [place]. This information is not intended to replace advice given to you by your health care provider. Make sure you discuss any questions you have with your health care provider.

## 2013-12-31 NOTE — Progress Notes (Signed)
RN went to pt room to give her ibuprofen and prenatal.  Rn saw another ibuprofen on tray.  RN questioned pt. About the pill she said at 6 am she was given the pill but spit it back out because she had no water to take it.  Pt took her pills  Then Rn rearraged her food tray that was just delivered.  Rn then looked for the pill to throw away the old ibuprofen and pt. Said she just took it.  RN asked if she just took it after the first ibuprofen she took.  She said yes.  Manfred ArchV. Latham called and notified.  Pt will not take 1800 dose, but will take midnight dose.

## 2013-12-31 NOTE — Progress Notes (Signed)
UR chart review completed.  

## 2014-01-01 MED ORDER — IBUPROFEN 600 MG PO TABS: 600.0000 mg | ORAL_TABLET | Freq: Four times a day (QID) | ORAL | Status: DC

## 2014-01-01 MED ORDER — FERROUS SULFATE 325 (65 FE) MG PO TABS: 325.0000 mg | ORAL_TABLET | Freq: Two times a day (BID) | ORAL | Status: DC

## 2014-01-01 MED ORDER — OXYCODONE-ACETAMINOPHEN 5-325 MG PO TABS: 1.0000 | ORAL_TABLET | ORAL | Status: DC | PRN

## 2014-01-01 NOTE — Progress Notes (Signed)
As soon as the patient got a discharge on both mama and baby, she began calling out and was asking when she could leave. Earlier in the morning I had overheard her on the phone with someone who is believed to be her mother, asking if she was going to bring the car seat.  I got into the room and did her discharge around 1145 and also removed the hugs tag.  She then told me that her ride was on the way and I let her know that the carseat must be brought into the room when her ride got there.     At Approximately 1300, the patient was seen in the hallway, walking out with a hospital gown on and the baby in her arms in hospital blankets.  She was stopped by a Agricultural consultantvolunteer and a Psychologist, sport and exercisenurse tech as well as [employer] Cover[employer], who happened to be in the hall.  The tech walked her back to the room to get dressed after House Coverage let the patient know that she could not leave the hospital with property of the hospital, but also offered to bring the patient blankets for the baby that she could keep.  All of the contents from the room were seen in the top of the patient's bag, including towels, wash clothes, and a blue laundry bag.  The FOB brought the carseat into the room and began saying that they had been stereotyped since they came into the hospital.  He began cursing as the volunteer were walking them out.  At the volunteer's desk, he refused the gift from them, while also cursing to them.  Once they got outside, he took the diaper bag from the volunteer and threw it on the ground, stating "We do not want anything from you."    According to the volunteer, the MOB got into the driver's seat and began trying to drive off, stating something along the lines of "I have to get out of here," in a very loud voice, while the FOB was trying to put the baby into the car. The volunteer yelled at her to stop and she did.  House Coverage was updated on these events, as well as Social work.  A Safety Zone is being put in as well.  This  nurse did not witness the events, but was told of them by The volunteer, House Coverage, and the Nurse Tech.

## 2014-01-01 NOTE — Plan of Care (Signed)
Problem: Discharge Progression Outcomes Goal: Barriers To Progression Addressed/Resolved Outcome: Completed/Met Date Met:  98/26/41 Goal: Complications resolved/controlled Outcome: Completed/Met Date Met:  01/01/14 Goal: Afebrile, VS remain stable at discharge Outcome: Completed/Met Date Met:  01/01/14 Goal: MMR given as ordered Outcome: Not Applicable Date Met:  58/30/94 Goal: Other Discharge Outcomes/Goals Outcome: Not Applicable Date Met:  07/68/08

## 2014-01-01 NOTE — Plan of Care (Signed)
Problem: Discharge Progression Outcomes Goal: Activity appropriate for discharge plan Outcome: Completed/Met Date Met:  01/01/14 Goal: Tolerating diet Outcome: Completed/Met Date Met:  01/01/14 Goal: Pain controlled with appropriate interventions Outcome: Completed/Met Date Met:  01/01/14 Goal: Discharge plan in place and appropriate Outcome: Completed/Met Date Met:  01/01/14     

## 2014-01-05 NOTE — Progress Notes (Signed)
CSW spoke with Erin Ridingolleen Shaw, LCSW who was covering for CSW on 11/5.   C.Johnston provided update on discharge of the MOB and the baby, and CSW reviewed RN note from discharge.  CSW made referral for [facility].    CSW received confirmation that the meconium drug screen is negative for the baby.

## 2014-09-10 ENCOUNTER — Inpatient Hospital Stay (HOSPITAL_COMMUNITY): Payer: BLUE CROSS/BLUE SHIELD

## 2014-09-10 ENCOUNTER — Encounter (HOSPITAL_COMMUNITY): Payer: Self-pay | Admitting: *Deleted

## 2014-09-10 ENCOUNTER — Observation Stay (HOSPITAL_COMMUNITY)
Admission: AD | Admit: 2014-09-10 | Discharge: 2014-09-12 | Disposition: A | Discharge status: Home / Self Care | Payer: BLUE CROSS/BLUE SHIELD | Source: Ambulatory Visit | Attending: Obstetrics & Gynecology | Admitting: Obstetrics & Gynecology

## 2014-09-10 DIAGNOSIS — Z3A00 Weeks of gestation of pregnancy not specified: Secondary | ICD-10-CM | POA: Insufficient documentation

## 2014-09-10 DIAGNOSIS — Z87891 Personal history of nicotine dependence: Secondary | ICD-10-CM | POA: Insufficient documentation

## 2014-09-10 DIAGNOSIS — O209 Hemorrhage in early pregnancy, unspecified: Secondary | ICD-10-CM

## 2014-09-10 DIAGNOSIS — N939 Abnormal uterine and vaginal bleeding, unspecified: Secondary | ICD-10-CM

## 2014-09-10 DIAGNOSIS — O039 Complete or unspecified spontaneous abortion without complication: Secondary | ICD-10-CM | POA: Diagnosis present

## 2014-09-10 DIAGNOSIS — O031 Delayed or excessive hemorrhage following incomplete spontaneous abortion: Secondary | ICD-10-CM | POA: Diagnosis present

## 2014-09-10 DIAGNOSIS — O9989 Other specified diseases and conditions complicating pregnancy, childbirth and the puerperium: Secondary | ICD-10-CM | POA: Diagnosis present

## 2014-09-10 DIAGNOSIS — R109 Unspecified abdominal pain: Secondary | ICD-10-CM | POA: Diagnosis not present

## 2014-09-10 DIAGNOSIS — O26899 Other specified pregnancy related conditions, unspecified trimester: Secondary | ICD-10-CM

## 2014-09-10 LAB — CBC
HCT: 30.5 % — ABNORMAL LOW (ref 36.0–46.0)
HEMATOCRIT: 24.2 % — AB (ref 36.0–46.0)
HEMOGLOBIN: 8 g/dL — AB (ref 12.0–15.0)
Hemoglobin: 9.8 g/dL — ABNORMAL LOW (ref 12.0–15.0)
MCH: 26.1 pg (ref 26.0–34.0)
MCH: 26.8 pg (ref 26.0–34.0)
MCHC: 32.1 g/dL (ref 30.0–36.0)
MCHC: 33.1 g/dL (ref 30.0–36.0)
MCV: 81.1 fL (ref 78.0–100.0)
MCV: 80.9 fL (ref 78.0–100.0)
Platelets: 214 10*3/uL (ref 150–400)
Platelets: 167 10*3/uL (ref 150–400)
RBC: 3.76 MIL/uL — ABNORMAL LOW (ref 3.87–5.11)
RBC: 2.99 MIL/uL — AB (ref 3.87–5.11)
RDW: 17.3 % — ABNORMAL HIGH (ref 11.5–15.5)
RDW: 17.4 % — ABNORMAL HIGH (ref 11.5–15.5)
WBC: 5.9 10*3/uL (ref 4.0–10.5)
WBC: 9.4 10*3/uL (ref 4.0–10.5)

## 2014-09-10 LAB — HCG, QUANTITATIVE, PREGNANCY: hCG, Beta Chain, Quant, S: 10514 m[IU]/mL — ABNORMAL HIGH (ref <5)

## 2014-09-10 LAB — POCT PREGNANCY, URINE: Preg Test, Ur: POSITIVE — AB

## 2014-09-10 MED ORDER — LACTATED RINGERS IV SOLN
INTRAVENOUS | Status: DC
  Administered 2014-09-10 – 2014-09-11 (×2): via INTRAVENOUS

## 2014-09-10 MED ORDER — MORPHINE SULFATE 4 MG/ML IJ SOLN
4.0000 mg | INTRAMUSCULAR | Status: DC | PRN
Start: 2014-09-10 — End: 2014-09-11
  Administered 2014-09-10: 4 mg via INTRAVENOUS
  Filled 2014-09-10: qty 1

## 2014-09-10 MED ORDER — MISOPROSTOL 200 MCG PO TABS
800.0000 ug | ORAL_TABLET | Freq: Once | ORAL | Status: AC
  Administered 2014-09-10: 800 ug via VAGINAL
  Filled 2014-09-10: qty 4

## 2014-09-10 MED ORDER — BUTORPHANOL TARTRATE 1 MG/ML IJ SOLN
1.0000 mg | INTRAMUSCULAR | Status: DC | PRN
  Administered 2014-09-10: 1 mg via INTRAVENOUS
  Filled 2014-09-10: qty 1

## 2014-09-10 NOTE — MAU Note (Signed)
Ultrasound at bedside

## 2014-09-10 NOTE — MAU Note (Signed)
Pt presents to MAU via EMS for heavy vaginal bleeding. Pt states she started her menstrual cycle 3 days ago and her cycles are usually heavy but she was standing at work and noticed that she was bleeding more than normal. One of her coworkers called EMS due to the heavy bleeding. Denies feeling faint or dizzy at this time.

## 2014-09-10 NOTE — MAU Provider Note (Signed)
History   29 yo [id number] presented via EMS with report of heavy bleeding with menstrual cycle.  Cycles started 3 days ago, with normally moderate to heavy cycles, but had onset of very heavy bleeding at work today, passing large clots, mild cramping.  Hx heavy cycles in past, but "nothing like this".  This cycle started on time.  Blood type O+.  Had vaginal delivery 12/2013 with [facility], with PNC beginning at 24 weeks, with a single f/u visit at 30 weeks, presented for delivery at 38 1/7 weeks, and no pp f/u.  She has not been seen at [facility] since delivery 12/2013.  Cycles resumed within 1-2 months after delivery, with regularity and moderate/heavy flow.  Hgb 10.9 on admission in November for delivery, 9.5 on day 1 pp.  She is sexually active, not using contraception, but "doesn't want to get pregnant".  Denies syncope or dizziness.  Presented with clothing heavily soiled with blood, large clots in underclothes.  Patient Active Problem List   Diagnosis Date Noted  . Abnormal vaginal bleeding 09/10/2014  . 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 Vaginal Bleeding   HPI:  See above  OB History    Gravida Para Term Preterm AB TAB SAB Ectopic Multiple Living   0 1    3 term vaginal deliveries, last one 12/2013, without complications. 2 TABs  Past Medical History  Diagnosis Date  . UTI (urinary tract infection)   . Vaginal Pap smear, abnormal     Past Surgical History  Procedure Laterality Date  . Cholecystectomy    . Induced abortion      Family History  Problem Relation Age of Onset  . Hypertension Mother     History  Substance Use Topics  . Smoking status: Former Smoker    Types: Cigarettes  . Smokeless tobacco: Never Used  . Alcohol Use: No    Allergies:  Allergies  Allergen Reactions  . Tomato Itching    Prescriptions prior to admission  Medication Sig Dispense Refill Last Dose   . ibuprofen (ADVIL,MOTRIN) 600 MG tablet Take 1 tablet (600 mg total) by mouth every 6 (six) hours. 30 tablet 0 09/10/2014 at Unknown time  . ferrous sulfate 325 (65 FE) MG tablet Take 1 tablet (325 mg total) by mouth 2 (two) times daily with a meal. (Patient not taking: Reported on 09/10/2014) 60 tablet 3   . oxyCODONE-acetaminophen (PERCOCET/ROXICET) 5-325 MG per tablet Take 1 tablet by mouth every 4 (four) hours as needed (for pain scale less than 7). (Patient not taking: Reported on 09/10/2014) 30 tablet 0     ROS:  Heavy vaginal bleeding, mild cramping. Physical Exam   Blood pressure 93/52, pulse 71, temperature 98.8 F (37.1 C), temperature source Oral, resp. rate 14, last menstrual period 09/07/2014, SpO2 100 %, unknown if currently breastfeeding.   Physical Exam   IV in place from EMS transport.  In NAD Chest clear Heart RRR without murmur Abd soft, mild tenderness Pelvic--several large clots adherent to vulva, dried blood on legs and feet. Small amount active bleeding at present. Patient tender with exam, with vaginal sensitivity on exam.  No evidence of lacerations. Cervix feels closed. Uterus small, mildly tender to palpation. Ext WNL    ED Course  Assessment: Heavy vaginal bleeding Stable hemodynamically Unknown if pregnant   Plan: Stat CBC, QHCG, and T&S. Continue IV hydration. Will consult  with Dr. Sallye OberKulwa when labs returned.   Nigel BridgemanLATHAM, Marites Nath CNM, MSN 09/10/2014 4p

## 2014-09-10 NOTE — H&P (Signed)
29 yo [id number] presented via EMS with report of heavy bleeding with menstrual cycle. Cycles started 3 days ago, with normally moderate to heavy cycles, but had onset of very heavy bleeding at work today, passing large clots, mild cramping. Hx heavy cycles in past, but "nothing like this". This cycle started on time. Blood type O+.  Had vaginal delivery 12/2013 with [facility], with PNC beginning at 24 weeks, with a single f/u visit at 30 weeks, presented for delivery at 38 1/7 weeks, and no pp f/u. [facility]. Cycles resumed within 1-2 months after delivery, with regularity and moderate/heavy flow. Hgb 10.9 on admission in November for delivery, 9.5 on day 1 pp.  She is sexually active, not using contraception, but "doesn't want to get pregnant".  Denies syncope or dizziness. Presented with clothing heavily soiled with blood, large clots in underclothes.  Patient Active Problem List   Diagnosis Date Noted  . Abnormal vaginal bleeding 09/10/2014  . 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 Vaginal Bleeding   HPI: See above  OB History    Gravida Para Term Preterm AB TAB SAB Ectopic Multiple Living   6 3 3  2 2    0 1    3 term vaginal deliveries, last one 12/2013, without complications. 2 TABs  Past Medical History  Diagnosis Date  . UTI (urinary tract infection)   . Vaginal Pap smear, abnormal     Past Surgical History  Procedure Laterality Date  . Cholecystectomy    . Induced abortion      Family History  Problem Relation Age of Onset  . Hypertension Mother     History  Substance Use Topics  . Smoking status: Former Smoker    Types: Cigarettes  . Smokeless tobacco: Never Used  . Alcohol Use: No    Allergies:  Allergies  Allergen Reactions  . Tomato  Itching    Prescriptions prior to admission  Medication Sig Dispense Refill Last Dose  . ibuprofen (ADVIL,MOTRIN) 600 MG tablet Take 1 tablet (600 mg total) by mouth every 6 (six) hours. 30 tablet 0 09/10/2014 at Unknown time  . ferrous sulfate 325 (65 FE) MG tablet Take 1 tablet (325 mg total) by mouth 2 (two) times daily with a meal. (Patient not taking: Reported on 09/10/2014) 60 tablet 3   . oxyCODONE-acetaminophen (PERCOCET/ROXICET) 5-325 MG per tablet Take 1 tablet by mouth every 4 (four) hours as needed (for pain scale less than 7). (Patient not taking: Reported on 09/10/2014) 30 tablet 0     ROS: Heavy vaginal bleeding, mild cramping. Physical Exam   Blood pressure 93/52, pulse 71, temperature 98.8 F (37.1 C), temperature source Oral, resp. rate 14, last menstrual period 09/07/2014, SpO2 100 %, unknown if currently breastfeeding.   Physical Exam   IV in place from EMS transport.  In NAD Chest clear Heart RRR without murmur Abd soft, mild tenderness Pelvic--several large clots adherent to vulva, dried blood on legs and feet. Small amount active bleeding at present. Patient tender with exam, with vaginal sensitivity on exam. No evidence of lacerations. Cervix feels closed. Uterus small, mildly tender to palpation. Ext WNL    ED Course  Assessment: Heavy vaginal bleeding Stable hemodynamically Unknown if pregnant   Plan: Stat CBC, QHCG, and T&S. Continue IV hydration. Will consult with Dr. Sallye OberKulwa when labs returned.   Nigel BridgemanLATHAM, Harveen Flesch CNM, MSN 09/10/2014 10:31  PM  Addendum: Patient became dizzy and weak, felt faint. BP 62/36, P 100. IV bolus given. Patient having severe cramp in right calf, massaged to resolution. Also now having severe cramping. Bleeding moderate at present--new pad approx 1/2 covered with blood, but no clots noted. Dr. Sallye Ober in to see patient. Bedside US done--unable to see pregnancy in uterus.  UPT +, QHCG  10514. Hgb 9.8  Formal US at bedside ordered. FINDINGS: The uterus is anteverted and slightly heterogeneous in echotexture. No intrauterine pregnancy or gestational sac identified. The possibility of an ectopic pregnancy is not entirely excluded. Correlation with serial HCG and follow-up ultrasound is recommended. Small amount of complex fluid containing echogenic debris is noted in the lower endometrial canal and endocervix which may represent proteinaceous debris or blood clot.  The right ovary measures 2.2 x 1.6 x 1.3 cm. The left ovary is not visualized.  Trace free fluid within the pelvis.  IMPRESSION: No intrauterine pregnancy. Correlation with serial HCG levels and follow-up with ultrasound recommended.  Pelvic exam by Dr. Sallye Ober, with several moderate/large clots removed from vaginal. Cervix appeared closed after removal of clots, with only small amount of pooling of blood in the posterior fornix after clot removal.  Patient very uncomfortable with exam--received Morphine 4 mg IM during course of exam. Significant cramping occuring.  VS stabilized with IV bolus.  Impression: Abnormal pregnancy, likely inevitable SAB O+  Plan:: Admit for observation Cytotech 800 mcg per vagina Repeat CBC at 7:30pm Stadol IV for pain NPO at present. Patient informed if heavy bleeding continues, or if condition becomes unstable, D&C may be required. Patient also informed of possible need for transfusion if she becomes hemodynamically unstable. Patient seems to understand these issues, along with the plan of care, and is agreeable with care as needed. Support to patient for unexpected pregnancy.  Nigel Bridgeman, CNM 09/10/14 7p

## 2014-09-10 NOTE — MAU Note (Signed)
Large amount of bleeding with numerous clots upon arrival EMS. Pt denies pain at this time

## 2014-09-10 NOTE — MAU Note (Signed)
Erin SonsVickie Latham CNM at bedside for pelvic exam, large amount of clots cleaned from pt's perineum. Pelvic performed by Nigel BridgemanVicki Latham CNM

## 2014-09-11 DIAGNOSIS — O039 Complete or unspecified spontaneous abortion without complication: Secondary | ICD-10-CM | POA: Diagnosis present

## 2014-09-11 LAB — PREPARE RBC (CROSSMATCH)

## 2014-09-11 LAB — CBC
HEMATOCRIT: 20.8 % — AB (ref 36.0–46.0)
Hemoglobin: 6.8 g/dL — CL (ref 12.0–15.0)
MCH: 26.7 pg (ref 26.0–34.0)
MCHC: 32.7 g/dL (ref 30.0–36.0)
MCV: 81.6 fL (ref 78.0–100.0)
PLATELETS: 151 10*3/uL (ref 150–400)
RBC: 2.55 MIL/uL — ABNORMAL LOW (ref 3.87–5.11)
RDW: 17.6 % — ABNORMAL HIGH (ref 11.5–15.5)
WBC: 4.9 10*3/uL (ref 4.0–10.5)

## 2014-09-11 MED ORDER — ACETAMINOPHEN 325 MG PO TABS
650.0000 mg | ORAL_TABLET | Freq: Once | ORAL | Status: AC
  Administered 2014-09-11: 650 mg via ORAL
  Filled 2014-09-11: qty 2

## 2014-09-11 MED ORDER — SODIUM CHLORIDE 0.9 % IV SOLN
Freq: Once | INTRAVENOUS | Status: AC
  Administered 2014-09-11: 13:00:00 via INTRAVENOUS

## 2014-09-11 MED ORDER — DIPHENHYDRAMINE HCL 50 MG/ML IJ SOLN
25.0000 mg | Freq: Once | INTRAMUSCULAR | Status: AC
  Administered 2014-09-11: 25 mg via INTRAVENOUS
  Filled 2014-09-11: qty 1

## 2014-09-11 MED ORDER — OXYCODONE-ACETAMINOPHEN 5-325 MG PO TABS
1.0000 | ORAL_TABLET | ORAL | Status: DC | PRN
  Administered 2014-09-11: 1 via ORAL
  Filled 2014-09-11: qty 1

## 2014-09-11 MED ORDER — OXYCODONE-ACETAMINOPHEN 5-325 MG PO TABS: 2.0000 | ORAL_TABLET | ORAL | Status: DC | PRN

## 2014-09-11 NOTE — Progress Notes (Signed)
Erin CoxBrittney S Fluke is a29 y.o.  [id number]  Admitted with heavy bleeding, QHCG 10514--unexpected pregnancy.  Subjective: Doing well--minimal bleeding overnight, no cramping. Stood at bedside during night, no syncope. Foley still in place. Patient remains NPO at present.   Objective: Vital signs in last 24 hours: Temp:  [98 F (36.7 C)-99 F (37.2 C)] 98.4 F (36.9 C) (07/15 0549) Pulse Rate:  [71-131] 80 (07/15 0549) Resp:  [14-18] 16 (07/15 0549) BP: (62-136)/(36-71) 98/64 mmHg (07/15 0549) SpO2:  [98 %-100 %] 98 % (07/15 0549)   Filed Vitals:   09/10/14 2022 09/10/14 2123 09/11/14 0133 09/11/14 0549  BP: 114/66 93/52 97/61  98/64  Pulse: 72 71 86 80  Temp: 99 F (37.2 C) 98.8 F (37.1 C) 98 F (36.7 C) 98.4 F (36.9 C)  TempSrc: Oral Oral Oral Oral  Resp: 15 14 14 16   SpO2: 100% 100% 100% 98%    Recent Labs Lab 09/10/14 1526 09/10/14 1923  WBC 5.9 9.4  HGB 9.8* 8.0*  HCT 30.5* 24.2*  PLT 214 167    EXAM: General: alert Resp: clear to auscultation bilaterally Cardio: regular rate and rhythm, S1, S2 normal, no murmur, click, rub or gallop Vaginal Bleeding: none   Assessment: SAB--unexpected pregnancy Hx heavy vaginal bleeding on admission, now minimal.  Plan: Continue NPO at present. Repeat CBC today Orthostatics Plan foley removal if orthostatically stable. Reviewed possible need for blood transfusion if hgb significantly decreased and/or if patient symptomatic. Will need f/u plan after d/c--if no D&C, will need weekly QHCGs and f/u in office.  Nigel BridgemanLATHAM, Briahna Pescador, CNM  09/11/2014 8:59 AM

## 2014-09-11 NOTE — Progress Notes (Signed)
I was referred by pt's RN due to pt having a miscarriage.    Erin Johnston reported that she has been so focused on physical recovery that she has not had an opportunity to focus on how she is doing emotionally and spiritually.  She just wants to get back to her normal life at this point and feel well.  [address]Chaplain Katy Potter [place] Pager, [phone] 2:11 PM    09/11/14 1400  Clinical Encounter Type  Visited With Patient  Visit Type Initial  Referral From Nurse

## 2014-09-12 LAB — CBC
HCT: 28.3 % — ABNORMAL LOW (ref 36.0–46.0)
Hemoglobin: 9.4 g/dL — ABNORMAL LOW (ref 12.0–15.0)
MCH: 26.6 pg (ref 26.0–34.0)
MCHC: 33.2 g/dL (ref 30.0–36.0)
MCV: 79.9 fL (ref 78.0–100.0)
Platelets: 165 10*3/uL (ref 150–400)
RBC: 3.54 MIL/uL — AB (ref 3.87–5.11)
RDW: 16.9 % — ABNORMAL HIGH (ref 11.5–15.5)
WBC: 7.1 10*3/uL (ref 4.0–10.5)

## 2014-09-12 LAB — TYPE AND SCREEN
ABO/RH(D): O POS
Antibody Screen: NEGATIVE
UNIT DIVISION: 0
Unit division: 0

## 2014-09-12 MED ORDER — IBUPROFEN 600 MG PO TABS: 600.0000 mg | ORAL_TABLET | Freq: Four times a day (QID) | ORAL | Status: DC

## 2014-09-12 NOTE — Discharge Summary (Signed)
Erin Johnston   Vaginal Delivery Discharge Summary  ALL information will be verified prior to discharge  Erin Johnston  DOB:    1985/12/25 MRN:    [id number] CSN:    [id number]  Date of admission:                  09/10/14  Date of discharge:                   09/12/14  History of Present Illness: Erin Johnston is a 29 y.o. female, [id number], who presents at Unknown weeks gestation. [facility]. She was admitted SAB. Her pregnancy has been complicated by:  Patient Active Problem List   Diagnosis Date Noted  . Miscarriage 09/11/2014  . Abnormal vaginal bleeding 09/10/2014  . Incomplete abortion with delayed or excessive hemorrhage 09/10/2014    Hospital course: The patient was admitted for VB/fetal loss.   Her labor was complicated, unknown pregnancy. She proceeded to have a SAB. Her delivery was complicated increase loss of blood. Her postpartum course was complicated a blood trnasfusion. She was discharged to home on postpartum day 2 doing well.  Subjective: Post Partum Day 2 Vaginal IUFD Patient up ad lib, denies syncope or dizziness. Reports consuming regular diet without issues and denies N/V No issues with urination and reports bleeding is appropriate  Feeding:  n/a Contraceptive plan:   Nexplanon  Objective: Temp:  [97.7 F (36.5 C)-99.4 F (37.4 C)] 97.7 F (36.5 C) (07/16 0604) Pulse Rate:  [70-104] 70 (07/16 0604) Resp:  [16-18] 16 (07/16 0604) BP: (91-112)/(46-65) 91/46 mmHg (07/16 0604) SpO2:  [98 %-100 %] 100 % (07/16 0604) Weight:  [163 lb (73.936 kg)] 163 lb (73.936 kg) (07/16 0900)  Physical Exam:  General: alert and cooperative Ext: WNL, no significant  edema. No evidence of DVT seen on physical exam. Uterine Fundus: firm Lochia: appropriate Breast: Soft filling Lungs: CTAB Heart RRR without murmur  Abdomen:  Soft, fundus firm, lochia  scant, + bowel sounds, non distended, non tender Lochia: appropriate Uterine Fundus: firm Laceration: n/a   PreNatal Labs ABO, Rh: --/--/O POS (07/14 1526)   Antibody: NEG (07/14 1526) Rubella:    RPR: NON REAC (11/03 0230)  HBsAg: Negative (07/24 0000)  HIV: Non-reactive (07/24 0000)  GBS: Negative (11/03 0000)   Intrapartum Procedures: SAB Postpartum Procedures: transfusion 2 units Complications-Operative and Postpartum: none   Discharge Diagnoses: SAB,  asymptomatic anemia  Activity:           pelvic rest Diet:                routine Medications: Ibuprofen and Iron Condition:      Stable    Discharge hemoglobin:  09/11/14 0936 09/12/14 0105  HGB 6.8* 9.4*  HCT 20.8* 28.3*    Assessment SAB Day 2 Stable  Normal Involution Anemia - hemodynamicly stable.  SP 2 unit transfused 09/10/14 QHCG 10514  Plan: Continue current care Dr. Charlotta Newtonzan updated on patient status  QHCG in 1 week and every week until <5  Discharge home in stable condition   Postpartum Teaching: Nutrition, exercise, return to work or school, family visits, sexual activity, home rest, vaginal bleeding, pelvic rest, family planning, s/s of PPD, breast care and peri-care   Discharge to: home  Follow-up Information    [facility] In 4 weeks.  Specialty:  Obstetrics and Gynecology   Why:  Postpartum check up and nexplanon placement   Contact information:   [address] [place] [address] [phone]       Amarii Amy, CNM, MSN 09/12/2014. 11:39 AM    Postpartum Care After Vaginal Delivery  After you deliver your newborn (postpartum period), the usual stay in the hospital is 24 72 hours. If there were problems with your labor or delivery, or if you have other medical problems, you might be in the hospital longer.  While you are in the hospital, you will receive help and instructions on how to care for  yourself and your newborn during the postpartum period.  While you are in the hospital:  Be sure to tell your nurses if you have pain or discomfort, as well as where you feel the pain and what makes the pain worse.  If you had an incision made near your vagina (episiotomy) or if you had some tearing during delivery, the nurses may put ice packs on your episiotomy or tear. The ice packs may help to reduce the pain and swelling.  If you are breastfeeding, you may feel uncomfortable contractions of your uterus for a couple of weeks. This is normal. The contractions help your uterus get back to normal size.  It is normal to have some bleeding after delivery.  For the first 1 3 days after delivery, the flow is red and the amount may be similar to a period.  It is common for the flow to start and stop.  In the first few days, you may pass some small clots. Let your nurses know if you begin to pass large clots or your flow increases.  Do not  flush blood clots down the toilet before having the nurse look at them.  During the next 3 10 days after delivery, your flow should become more watery and pink or brown-tinged in color.  Ten to fourteen days after delivery, your flow should be a small amount of yellowish-white discharge.  The amount of your flow will decrease over the first few weeks after delivery. Your flow may stop in 6 8 weeks. Most women have had their flow stop by 12 weeks after delivery.  You should change your sanitary pads frequently.  Wash your hands thoroughly with soap and water for at least 20 seconds after changing pads, using the toilet, or before holding or feeding your newborn.  You should feel like you need to empty your bladder within the first 6 8 hours after delivery.  In case you become weak, lightheaded, or faint, call your nurse before you get out of bed for the first time and before you take a shower for the first time.  Within the first few days after  delivery, your breasts may begin to feel tender and full. This is called engorgement. Breast tenderness usually goes away within 48 72 hours after engorgement occurs. You may also notice milk leaking from your breasts. If you are not breastfeeding, do not stimulate your breasts. Breast stimulation can make your breasts produce more milk.  Spending as much time as possible with your newborn is very important. During this time, you and your newborn can feel close and get to know each other. Having your newborn stay in your room (rooming in) will help to strengthen the bond with your newborn. It will give you time to get to know your newborn and become comfortable caring for your newborn.  Your hormones change after delivery. Sometimes the hormone changes can temporarily cause you to feel sad or tearful. These feelings should not last more than a few days. If these feelings last longer than that, you should talk to your caregiver.  If desired, talk to your caregiver about methods of family planning or contraception.  Talk to your caregiver about immunizations. Your caregiver may want you to have the following immunizations before leaving the hospital:  Tetanus, diphtheria, and pertussis (Tdap) or tetanus and diphtheria (Td) immunization. It is very important that you and your family (including grandparents) or others caring for your newborn are up-to-date with the Tdap or Td immunizations. The Tdap or Td immunization can help protect your newborn from getting ill.  Rubella immunization.  Varicella (chickenpox) immunization.  Influenza immunization. You should receive this annual immunization if you did not receive the immunization during your pregnancy. Document Released: 12/11/2006 Document Revised: 11/08/2011 Document Reviewed: 10/11/2011 [facility] Patient Information 2014 [place], [place].   Postpartum Depression and Baby Blues  The postpartum period begins right after the birth of a baby.  During this time, there is often a great amount of joy and excitement. It is also a time of considerable changes in the life of the parent(s). Regardless of how many times a mother gives birth, each child brings new challenges and dynamics to the family. It is not unusual to have feelings of excitement accompanied by confusing shifts in moods, emotions, and thoughts. All mothers are at risk of developing postpartum depression or the "baby blues." These mood changes can occur right after giving birth, or they may occur many months after giving birth. The baby blues or postpartum depression can be mild or severe. Additionally, postpartum depression can resolve rather quickly, or it can be a long-term condition. CAUSES Elevated hormones and their rapid decline are thought to be a main cause of postpartum depression and the baby blues. There are a number of hormones that radically change during and after pregnancy. Estrogen and progesterone usually decrease immediately after delivering your baby. The level of thyroid hormone and various cortisol steroids also rapidly drop. Other factors that play a major role in these changes include major life events and genetics.  RISK FACTORS If you have any of the following risks for the baby blues or postpartum depression, know what symptoms to watch out for during the postpartum period. Risk factors that may increase the likelihood of getting the baby blues or postpartum depression include:  Havinga personal or family history of depression.  Having depression while being pregnant.  Having premenstrual or oral contraceptive-associated mood issues.  Having exceptional life stress.  Having marital conflict.  Lacking a social support network.  Having a baby with special needs.  Having health problems such as diabetes. SYMPTOMS Baby blues symptoms include:  Brief fluctuations in mood, such as going from extreme happiness to sadness.  Decreased  concentration.  Difficulty sleeping.  Crying spells, tearfulness.  Irritability.  Anxiety. Postpartum depression symptoms typically begin within the first month after giving birth. These symptoms include:  Difficulty sleeping or excessive sleepiness.  Marked weight loss.  Agitation.  Feelings of worthlessness.  Lack of interest in activity or food. Postpartum psychosis is a very concerning condition and can be dangerous. Fortunately, it is rare. Displaying any of the following symptoms is cause for immediate medical attention. Postpartum psychosis symptoms include:  Hallucinations and delusions.  Bizarre or disorganized behavior.  Confusion or disorientation. DIAGNOSIS  A diagnosis is made by an evaluation  of your symptoms. There are no medical or lab tests that lead to a diagnosis, but there are various questionnaires that a caregiver may use to identify those with the baby blues, postpartum depression, or psychosis. Often times, a screening tool called the [place] Postnatal Depression Scale is used to diagnose depression in the postpartum period.  TREATMENT The baby blues usually goes away on its own in 1 to 2 weeks. Social support is often all that is needed. You should be encouraged to get adequate sleep and rest. Occasionally, you may be given medicines to help you sleep.  Postpartum depression requires treatment as it can last several months or longer if it is not treated. Treatment may include individual or group therapy, medicine, or both to address any social, physiological, and psychological factors that may play a role in the depression. Regular exercise, a healthy diet, rest, and social support may also be strongly recommended.  Postpartum psychosis is more serious and needs treatment right away. Hospitalization is often needed. HOME CARE INSTRUCTIONS  Get as much rest as you can. Nap when the baby sleeps.  Exercise regularly. Some women find yoga and walking to be  beneficial.  Eat a balanced and nourishing diet.  Do little things that you enjoy. Have a cup of tea, take a bubble bath, read your favorite magazine, or listen to your favorite music.  Avoid alcohol.  Ask for help with household chores, cooking, grocery shopping, or running errands as needed. Do not try to do everything.  Talk to people close to you about how you are feeling. Get support from your partner, family members, friends, or other new moms.  Try to stay positive in how you think. Think about the things you are grateful for.  Do not spend a lot of time alone.  Only take medicine as directed by your caregiver.  Keep all your postpartum appointments.  Let your caregiver know if you have any concerns. SEEK MEDICAL CARE IF: You are having a reaction or problems with your medicine. SEEK IMMEDIATE MEDICAL CARE IF:  You have suicidal feelings.  You feel you may harm the baby or someone else. Document Released: 11/18/2003 Document Revised: 05/08/2011 Document Reviewed: 12/20/2010 [facility] Patient Information 2014 [place], [place].

## 2014-09-12 NOTE — Progress Notes (Deleted)
Erin Johnston   Subjective: Post Partum Day 2 Vaginal IUFD Patient up ad lib, denies syncope or dizziness. Reports consuming regular diet without issues and denies N/V No issues with urination and reports bleeding is appropriate  Feeding:  n/a Contraceptive plan:   Nexplanon  Objective: Temp:  [97.7 F (36.5 C)-99.4 F (37.4 C)] 97.7 F (36.5 C) (07/16 0604) Pulse Rate:  [70-104] 70 (07/16 0604) Resp:  [16-18] 16 (07/16 0604) BP: (91-112)/(46-65) 91/46 mmHg (07/16 0604) SpO2:  [98 %-100 %] 100 % (07/16 0604) Weight:  [163 lb (73.936 kg)] 163 lb (73.936 kg) (07/16 0900)  Physical Exam:  General: alert and cooperative Ext: WNL, no significant  edema. No evidence of DVT seen on physical exam. Uterine Fundus: firm Lochia: appropriate Breast: Soft filling Lungs: CTAB Heart RRR without murmur  Abdomen:  Soft, fundus firm, lochia scant, + bowel sounds, non distended, non tender Lochia: appropriate Uterine Fundus: firm Laceration: n/a   Recent Labs  09/11/14 0936 09/12/14 0105  HGB 6.8* 9.4*  HCT 20.8* 28.3*    Assessment SAB Day 2 Stable  Normal Involution Anemia - hemodynamicly stable.  SP 2 unit transfused 09/10/14 QHCG 10514  Plan: Continue current care Dr. Charlotta Newtonzan updated on patient status  QHCG in 1 week and every week until <5  Discharge home    Coila Wardell, CNM, MSN 09/12/2014, 11:27 AM

## 2014-09-12 NOTE — Progress Notes (Signed)
Discharge teaching complete. Pt understood all information and did not have any questions. Pt ambulated out of the hospital and discharged home to family.  

## 2015-02-28 NOTE — L&D Delivery Note (Signed)
Patient is 30 y.o. [id number] admitted for active labor, hx of late prenatal care   Delivery Note At 12:54 PM a viable and healthy female was delivered via  (Presentation: ROA ).  APGAR: 9, 9;  Placenta status: intact, hypercoiled .  Cord: 3 vessel, nuchal 1, body 1  Without complications  Anesthesia:  epidural Episiotomy:  none Lacerations:  1st degree L labial Suture Repair: none Est. Blood Loss (mL):  100mL  Mom to postpartum.  Baby to Couplet care / Skin to Skin.  Leland HerElsia J Johnston 10/19/2015, 1:16 PM      Upon arrival patient was complete and pushing. She pushed with good maternal effort to deliver a healthy baby girl. Baby delivered without difficulty, was noted to have good tone and place on maternal abdomen for oral suctioning, drying and stimulation. Delayed cord clamping performed. Placenta delivered intact with 3V cord. Vaginal canal and perineum was inspected and no repair was needed; hemostatic. Pitocin was started and uterus massaged until bleeding slowed. Counts of sharps, instruments, and lap pads were all correct.   Leland HerElsia J Yoo, DO PGY-1, [facility] 8/22/20171:16 PM   OB FELLOW DELIVERY ATTESTATION  I was gloved and present for the delivery in its entirety, and I agree with the above resident's note.    Ernestina PennaNicholas Schenk, MD 1:49 PM

## 2015-06-17 ENCOUNTER — Emergency Department (HOSPITAL_COMMUNITY): Payer: BLUE CROSS/BLUE SHIELD

## 2015-06-17 ENCOUNTER — Emergency Department (HOSPITAL_COMMUNITY)
Admission: EM | Admit: 2015-06-17 | Discharge: 2015-06-18 | Disposition: A | Discharge status: Home / Self Care | Payer: BLUE CROSS/BLUE SHIELD | Attending: Emergency Medicine | Admitting: Emergency Medicine

## 2015-06-17 ENCOUNTER — Encounter (HOSPITAL_COMMUNITY): Payer: Self-pay | Admitting: Emergency Medicine

## 2015-06-17 DIAGNOSIS — S0993XA Unspecified injury of face, initial encounter: Secondary | ICD-10-CM | POA: Diagnosis not present

## 2015-06-17 DIAGNOSIS — Z87891 Personal history of nicotine dependence: Secondary | ICD-10-CM | POA: Diagnosis not present

## 2015-06-17 DIAGNOSIS — Y9389 Activity, other specified: Secondary | ICD-10-CM | POA: Insufficient documentation

## 2015-06-17 DIAGNOSIS — Z3A00 Weeks of gestation of pregnancy not specified: Secondary | ICD-10-CM | POA: Diagnosis not present

## 2015-06-17 DIAGNOSIS — Z87440 Personal history of urinary (tract) infections: Secondary | ICD-10-CM | POA: Diagnosis not present

## 2015-06-17 DIAGNOSIS — O9A212 Injury, poisoning and certain other consequences of external causes complicating pregnancy, second trimester: Secondary | ICD-10-CM | POA: Insufficient documentation

## 2015-06-17 DIAGNOSIS — S29001A Unspecified injury of muscle and tendon of front wall of thorax, initial encounter: Secondary | ICD-10-CM | POA: Insufficient documentation

## 2015-06-17 DIAGNOSIS — Y998 Other external cause status: Secondary | ICD-10-CM | POA: Insufficient documentation

## 2015-06-17 DIAGNOSIS — R220 Localized swelling, mass and lump, head: Secondary | ICD-10-CM

## 2015-06-17 DIAGNOSIS — Y9289 Other specified places as the place of occurrence of the external cause: Secondary | ICD-10-CM | POA: Diagnosis not present
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
## 2015-06-18 NOTE — Discharge Instructions (Signed)
Tylenol for pain.  The CT scan did not show any abnormality other than the swollen area.  Use ice to help reduce the swelling
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
## 2015-07-16 ENCOUNTER — Encounter (HOSPITAL_COMMUNITY): Payer: Self-pay | Admitting: *Deleted

## 2015-08-23 ENCOUNTER — Ambulatory Visit (INDEPENDENT_AMBULATORY_CARE_PROVIDER_SITE_OTHER): Payer: BLUE CROSS/BLUE SHIELD

## 2015-08-23 DIAGNOSIS — Z3201 Encounter for pregnancy test, result positive: Secondary | ICD-10-CM

## 2015-08-23 LAB — POCT PREGNANCY, URINE: Preg Test, Ur: POSITIVE — AB

## 2015-08-23 NOTE — Progress Notes (Signed)
Patient ID: [name], female   DOB: 03-24-1985, 30 y.o.   MRN: [id number] Pt presents to clinic today for a pregnancy test pt states her LMP was 01/21/15 making her [id number] pt states she has not begun care anywhere due to her insurance. Pt has no problems or questions today.

## 2015-08-24 ENCOUNTER — Ambulatory Visit (INDEPENDENT_AMBULATORY_CARE_PROVIDER_SITE_OTHER): Payer: BLUE CROSS/BLUE SHIELD | Admitting: Physician Assistant

## 2015-08-24 VITALS — BP 122/80 | HR 92 | Temp 98.0°F | Resp 17 | Ht 60.0 in | Wt 155.0 lb

## 2015-08-24 DIAGNOSIS — O26893 Other specified pregnancy related conditions, third trimester: Secondary | ICD-10-CM

## 2015-08-24 DIAGNOSIS — M549 Dorsalgia, unspecified: Secondary | ICD-10-CM

## 2015-08-24 DIAGNOSIS — O9989 Other specified diseases and conditions complicating pregnancy, childbirth and the puerperium: Principal | ICD-10-CM

## 2015-08-24 NOTE — Patient Instructions (Signed)
     IF you received an x-ray today, you will receive an invoice from [facility]. Please contact [facility] at [phone] with questions or concerns regarding your invoice.   IF you received labwork today, you will receive an invoice from [facility]. Please contact [facility] at [phone] with questions or concerns regarding your invoice.   Our billing staff will not be able to assist you with questions regarding bills from these companies.  You will be contacted with the lab results as soon as they are available. The fastest way to get your results is to activate your My Chart account. Instructions are located on the last page of this paperwork. If you have not heard from us regarding the results in 2 weeks, please contact this office.      

## 2015-08-24 NOTE — Progress Notes (Signed)
08/24/2015 10:58 AM   DOB: 08/30/1985 / MRN: [id number]  SUBJECTIVE:  Erin Johnston is a [age] y.o. female presenting for back pain.  She is roughly [redacted] weeks pregnant.  She has not received any OB care thus far and has an appointment at [facility] on the 28th of July which she plans to keep.  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  She has been taking tylenol with some relief however the back pain is becoming unbearable late in her shifts.  She has a history of similar back pain with previous pregnancies.  She denies dysuria, urgency.  She denies vaginal bloody vaginal discharge.  Reports the baby kicks often.    She is allergic to tomato.   She  has a past medical history of UTI (urinary tract infection) and Vaginal Pap smear, abnormal.    She  reports that she has quit smoking. Her smoking use included Cigarettes. She has never used smokeless tobacco. She reports that she does not drink alcohol or use illicit drugs. She  reports that she currently engages in sexual activity. She reports using the following method of birth control/protection: None. The patient  has past surgical history that includes Cholecystectomy and Induced abortion.  Her family history includes Hypertension in her mother.  Review of Systems  Constitutional: Negative for fever and chills.  Eyes: Negative for blurred vision.  Respiratory: Negative for cough and shortness of breath.   Cardiovascular: Negative for chest pain.  Gastrointestinal: Negative for nausea and abdominal pain.  Genitourinary: Negative for dysuria, urgency and frequency.  Musculoskeletal: Positive for myalgias and back pain.  Skin: Negative for rash.  Neurological: Negative for dizziness, tingling and headaches.  Psychiatric/Behavioral: Negative for depression. The patient is  not nervous/anxious.     Problem list and medications reviewed and updated by myself where necessary, and exist elsewhere in the encounter.   OBJECTIVE:  BP 122/80 mmHg  Pulse 92  Temp(Src) 98 F (36.7 C) (Oral)  Resp 17  Ht 5' (1.524 m)  Wt 155 lb (70.308 kg)  BMI 30.27 kg/m2  SpO2 100%  LMP 01/21/2015  Breastfeeding? No  Physical Exam  Constitutional: She is oriented to person, place, and time. She appears well-nourished. No distress.  Eyes: EOM are normal. Pupils are equal, round, and reactive to light.  Cardiovascular: Normal rate.   Pulmonary/Chest: Effort normal.  Abdominal: She exhibits distension. She exhibits no mass. There is no tenderness. There is no rebound, no guarding and no CVA tenderness.  Fundal height at 30 cm. Fetus felt moving upon my exam.   Musculoskeletal:       Lumbar back: She exhibits pain. She exhibits no tenderness, no bony tenderness, no swelling and no spasm.  Neurological: She is alert and oriented to person, place, and time. She has normal strength. No cranial nerve deficit or sensory deficit. Gait normal. GCS eye subscore is 4. GCS verbal subscore is 5. GCS motor subscore is 6.  Reflex Scores:      Patellar reflexes are 2+ on the right side and 2+ on the left side.      Achilles reflexes are 2+ on the right side and 2+ on the left side. Skin: Skin is dry. She is not diaphoretic.  Psychiatric: She has a normal mood and affect.  Vitals reviewed.   Results for orders placed or performed in visit on 08/23/15 (from the past 72 hour(s))  Pregnancy, urine POC     Status: Abnormal   Collection Time: 08/23/15  3:54 PM  Result Value Ref Range   Preg Test, Ur POSITIVE (A) NEGATIVE    Comment:        THE SENSITIVITY OF THIS METHODOLOGY IS >24 mIU/mL     No results found.  ASSESSMENT AND PLAN  [name] was seen today for back pain.  Diagnoses and all orders for this visit:  Back pain affecting pregnancy in third trimester:  I have taken her  out of work as of today.  Advised that she take a Women's daily multivitamin with folate and avoid all medications except for tylenol OTC.  Advised she keep her OB appointment.     The patient was advised to call or return to clinic if she does not see an improvement in symptoms or to seek the care of the closest emergency department if she worsens with the above plan.   Deliah BostonMichael Clark, MHS, PA-C [facility] 08/24/2015 10:58 AM

## 2015-09-15 ENCOUNTER — Encounter: Payer: BLUE CROSS/BLUE SHIELD | Admitting: Obstetrics & Gynecology

## 2015-09-29 ENCOUNTER — Ambulatory Visit (INDEPENDENT_AMBULATORY_CARE_PROVIDER_SITE_OTHER): Payer: BLUE CROSS/BLUE SHIELD | Admitting: Obstetrics and Gynecology

## 2015-09-29 ENCOUNTER — Other Ambulatory Visit: Payer: Self-pay | Admitting: Obstetrics and Gynecology

## 2015-09-29 ENCOUNTER — Encounter: Payer: Self-pay | Admitting: Obstetrics and Gynecology

## 2015-09-29 VITALS — BP 128/83 | HR 89 | Wt 163.3 lb

## 2015-09-29 DIAGNOSIS — O0930 Supervision of pregnancy with insufficient antenatal care, unspecified trimester: Secondary | ICD-10-CM | POA: Insufficient documentation

## 2015-09-29 DIAGNOSIS — O26893 Other specified pregnancy related conditions, third trimester: Secondary | ICD-10-CM

## 2015-09-29 DIAGNOSIS — M549 Dorsalgia, unspecified: Secondary | ICD-10-CM

## 2015-09-29 DIAGNOSIS — Z8759 Personal history of other complications of pregnancy, childbirth and the puerperium: Secondary | ICD-10-CM

## 2015-09-29 DIAGNOSIS — O0933 Supervision of pregnancy with insufficient antenatal care, third trimester: Secondary | ICD-10-CM | POA: Diagnosis not present

## 2015-09-29 DIAGNOSIS — Z3493 Encounter for supervision of normal pregnancy, unspecified, third trimester: Secondary | ICD-10-CM | POA: Diagnosis not present

## 2015-09-29 HISTORY — DX: Personal history of other complications of pregnancy, childbirth and the puerperium: Z87.59

## 2015-09-29 LAB — POCT URINALYSIS DIP (DEVICE)
BILIRUBIN URINE: NEGATIVE
GLUCOSE, UA: NEGATIVE mg/dL
Hgb urine dipstick: NEGATIVE
KETONES UR: NEGATIVE mg/dL
NITRITE: NEGATIVE
Protein, ur: NEGATIVE mg/dL
Specific Gravity, Urine: 1.015 (ref 1.005–1.030)
Urobilinogen, UA: 1 mg/dL (ref 0.0–1.0)
pH: 6.5 (ref 5.0–8.0)

## 2015-09-29 MED ORDER — CYCLOBENZAPRINE HCL 10 MG PO TABS: 10.0000 mg | ORAL_TABLET | Freq: Three times a day (TID) | ORAL | 0 refills | Status: DC | PRN

## 2015-09-29 NOTE — Progress Notes (Signed)
Subjective:    Erin Johnston is a [id number] [redacted]w[redacted]d being seen today for her first obstetrical visit.  Her obstetrical history is significant for Late to care @ 35 weeks , post partum gestational hypertension, and cholecystectomy.   Patient does not intend to breast feed. Pregnancy history fully reviewed.  Patient reports backache. Worse with movement, hurts worse when she is on her feet all day at work.   Vitals:   09/29/15 0809  BP: 128/83  Pulse: 89  Weight: 163 lb 4.8 oz (74.1 kg)    HISTORY: OB History  Gravida Para Term Preterm AB Living  SAB TAB Ectopic Multiple Live Births  1 2   0 3    # Outcome Date GA Lbr Len/2nd Weight Sex Delivery Anes PTL Lv  7 Current           6 SAB 2016          5 Term 12/30/13 [redacted]w[redacted]d 15:03 / 00:05 7 lb 11.1 oz (3.49 kg) F Vag-Spont EPI  LIV  4 TAB 2012          3 TAB 2012          2 Term 05/11/07 [redacted]w[redacted]d  6 lb 11 oz (3.033 kg) F Vag-Spont EPI  LIV     Birth Comments: no complications, born at [facility] in [place], [place]  1 Term 05/17/06 [redacted]w[redacted]d  6 lb 1 oz (2.75 kg)  Vag-Spont EPI  LIV     Birth Comments: blood pressure elevated after delivery, born at [facility] in [place], [place]     Past Medical History:  Diagnosis Date  . Hypertension    after delivery first child  . UTI (urinary tract infection)   . Vaginal Pap smear, abnormal    Past Surgical History:  Procedure Laterality Date  . CHOLECYSTECTOMY    . INDUCED ABORTION     Family History  Problem Relation Age of Onset  . Hypertension Mother      Exam   BP 128/83   Pulse 89   Wt 163 lb 4.8 oz (74.1 kg)   LMP 01/21/2015   BMI 31.89 kg/m  Uterine Size: size equals dates  Pelvic Exam:    Perineum: No Hemorrhoids, Normal Perineum   Vulva: normal   Vagina:  normal mucosa, normal discharge, no palpable nodules   pH: Not done   Cervix: no bleeding following Pap, no cervical motion tenderness and no lesions, cervix closed, soft, anterior.    Adnexa: normal  adnexa and no mass, fullness, tenderness   Bony Pelvis: Adequate   Skin: normal coloration and turgor, no rashes    Neurologic: negative   Extremities: normal strength, tone, and muscle mass   HEENT neck supple with midline trachea and thyroid without masses   Mouth/Teeth mucous membranes moist, pharynx normal without lesions   Neck supple and no masses   Cardiovascular: regular rate and rhythm, no murmurs or gallops   Respiratory:  appears well, vitals normal, no respiratory distress, acyanotic, normal RR, neck free of mass or lymphadenopathy, chest clear, no wheezing, crepitations, rhonchi, normal symmetric air entry   Abdomen: soft, non-tender; bowel sounds normal; no masses,  no organomegaly   Urinary: urethral meatus normal   Assessment:    Pregnancy: [id number] Patient Active Problem List   Diagnosis Date Noted  . Late prenatal care affecting pregnancy, antepartum 09/29/2015  . Supervision of low-risk pregnancy 09/29/2015  . History of gestational hypertension  09/29/2015  . Pregnancy related back pain in third trimester, antepartum 09/29/2015    Plan:   1. Supervision of normal pregnancy, third trimester  - Glucose Tolerance, 1 HR (50g) - Prenatal Profile - Pain Mgmt, Profile 6 Conf w/o mM, U - GC/Chlamydia probe amp ([place])not at [facility] - Culture, OB Urine - Culture, beta strep (group b only) - Hemoglobinopathy Evaluation - Cytology - PAP - [place] MFM OB COMP + 14 WK; Future - Patient refused Tdap  -Prenatal vitamins. -Problem list reviewed and updated.  2. Late prenatal care affecting pregnancy, antepartum, third trimester -   Weekly visits - - [place] MFM OB COMP + 14 WK; scheduled for 8/7  3.  History of gestational hypertension - Affecting first pregnancy only; post partum - Will continue to monitor. BP normal today  - protein creatine ratio- baseline   4. Pregnancy related back pain in third trimester, antepartum - Patient works long hours and is on her feet a  lot. Discussed exercises  - UA  - Rx: Flexeril    Follow up in 1 week.   Duane Lope, NP 09/29/2015 8:37 AM

## 2015-09-29 NOTE — Progress Notes (Signed)
Here for initial visit. Given new prenatal information. Declines tdap.

## 2015-09-30 ENCOUNTER — Other Ambulatory Visit: Payer: Self-pay | Admitting: Obstetrics and Gynecology

## 2015-09-30 DIAGNOSIS — Z789 Other specified health status: Secondary | ICD-10-CM | POA: Insufficient documentation

## 2015-09-30 DIAGNOSIS — O99019 Anemia complicating pregnancy, unspecified trimester: Secondary | ICD-10-CM | POA: Insufficient documentation

## 2015-09-30 DIAGNOSIS — O99013 Anemia complicating pregnancy, third trimester: Secondary | ICD-10-CM

## 2015-09-30 LAB — PRENATAL PROFILE (SOLSTAS)
Antibody Screen: NEGATIVE
Basophils Absolute: 0 cells/uL (ref 0–200)
Basophils Relative: 0 %
EOS ABS: 126 {cells}/uL (ref 15–500)
Eosinophils Relative: 2 %
HCT: 28.9 % — ABNORMAL LOW (ref 35.0–45.0)
HEMOGLOBIN: 9.1 g/dL — AB (ref 11.7–15.5)
HIV 1&2 Ab, 4th Generation: NONREACTIVE
Hepatitis B Surface Ag: NEGATIVE
Lymphocytes Relative: 29 %
Lymphs Abs: 1827 cells/uL (ref 850–3900)
MCH: 23.3 pg — AB (ref 27.0–33.0)
MCHC: 31.5 g/dL — ABNORMAL LOW (ref 32.0–36.0)
MCV: 74.1 fL — ABNORMAL LOW (ref 80.0–100.0)
MPV: 11 fL (ref 7.5–12.5)
Monocytes Absolute: 504 cells/uL (ref 200–950)
Monocytes Relative: 8 %
NEUTROS ABS: 3843 {cells}/uL (ref 1500–7800)
Neutrophils Relative %: 61 %
PLATELETS: 263 10*3/uL (ref 140–400)
RBC: 3.9 MIL/uL (ref 3.80–5.10)
RDW: 16.3 % — ABNORMAL HIGH (ref 11.0–15.0)
Rh Type: POSITIVE
Rubella: 0.96 Index — ABNORMAL HIGH (ref <0.90)
WBC: 6.3 10*3/uL (ref 3.8–10.8)

## 2015-09-30 LAB — GC/CHLAMYDIA PROBE AMP (~~LOC~~) NOT AT ARMC
CHLAMYDIA, DNA PROBE: NEGATIVE
Neisseria Gonorrhea: NEGATIVE

## 2015-09-30 LAB — CULTURE, BETA STREP (GROUP B ONLY)

## 2015-09-30 LAB — PROTEIN / CREATININE RATIO, URINE
Creatinine, Urine: 108 mg/dL (ref 20–320)
Protein Creatinine Ratio: 148 mg/g creat (ref 21–161)
Total Protein, Urine: 16 mg/dL (ref 5–24)

## 2015-09-30 LAB — CYTOLOGY - PAP

## 2015-09-30 LAB — GLUCOSE TOLERANCE, 1 HOUR (50G) W/O FASTING: Glucose, 1 Hr, gestational: 80 mg/dL (ref <140)

## 2015-09-30 LAB — CULTURE, OB URINE: ORGANISM ID, BACTERIA: NO GROWTH

## 2015-10-01 ENCOUNTER — Encounter: Payer: Self-pay | Admitting: Obstetrics and Gynecology

## 2015-10-01 ENCOUNTER — Other Ambulatory Visit: Payer: Self-pay | Admitting: Obstetrics and Gynecology

## 2015-10-01 DIAGNOSIS — B951 Streptococcus, group B, as the cause of diseases classified elsewhere: Secondary | ICD-10-CM | POA: Insufficient documentation

## 2015-10-01 LAB — HEMOGLOBINOPATHY EVALUATION
HCT: 28.9 % — ABNORMAL LOW (ref 35.0–45.0)
HGB A2 QUANT: 1.9 % (ref 1.8–3.5)
Hemoglobin: 9.1 g/dL — ABNORMAL LOW (ref 11.7–15.5)
Hgb A: 97.1 % (ref >96.0)
Hgb F Quant: <1 % (ref <2.0)
MCH: 23.3 pg — ABNORMAL LOW (ref 27.0–33.0)
MCV: 74.1 fL — ABNORMAL LOW (ref 80.0–100.0)
RBC: 3.9 MIL/uL (ref 3.80–5.10)
RDW: 16.3 % — ABNORMAL HIGH (ref 11.0–15.0)

## 2015-10-04 ENCOUNTER — Ambulatory Visit (HOSPITAL_COMMUNITY)
Admission: RE | Admit: 2015-10-04 | Discharge: 2015-10-04 | Disposition: A | Discharge status: Home / Self Care | Payer: BLUE CROSS/BLUE SHIELD | Source: Ambulatory Visit | Attending: Obstetrics and Gynecology | Admitting: Obstetrics and Gynecology

## 2015-10-04 DIAGNOSIS — Z36 Encounter for antenatal screening of mother: Secondary | ICD-10-CM | POA: Insufficient documentation

## 2015-10-04 DIAGNOSIS — Z3493 Encounter for supervision of normal pregnancy, unspecified, third trimester: Secondary | ICD-10-CM

## 2015-10-04 DIAGNOSIS — Z3A36 36 weeks gestation of pregnancy: Secondary | ICD-10-CM | POA: Insufficient documentation

## 2015-10-04 DIAGNOSIS — O0933 Supervision of pregnancy with insufficient antenatal care, third trimester: Secondary | ICD-10-CM | POA: Insufficient documentation

## 2015-10-08 ENCOUNTER — Telehealth: Payer: Self-pay | Admitting: Obstetrics and Gynecology

## 2015-10-08 LAB — PAIN MGMT, PROFILE 6 CONF W/O MM, U
6 Acetylmorphine: NEGATIVE ng/mL (ref <10)
ALCOHOL METABOLITES: NEGATIVE ng/mL (ref <500)
Amphetamines: NEGATIVE ng/mL (ref <500)
BENZODIAZEPINES: NEGATIVE ng/mL (ref <100)
Barbiturates: NEGATIVE ng/mL (ref <300)
COCAINE METABOLITE: NEGATIVE ng/mL (ref <150)
Creatinine: 96.1 mg/dL (ref >=20.0)
MARIJUANA METABOLITE: NEGATIVE ng/mL (ref <20)
METHADONE METABOLITE: NEGATIVE ng/mL (ref <100)
OPIATES: NEGATIVE ng/mL (ref <100)
Oxidant: NEGATIVE ug/mL (ref <200)
Oxycodone: NEGATIVE ng/mL (ref <100)
Phencyclidine: NEGATIVE ng/mL (ref <25)
Please note:: 0
pH: 6.93 (ref 4.5–9.0)

## 2015-10-08 NOTE — Telephone Encounter (Signed)
Attempted to call patient to discuss recent US results and labs. Unable to leave a message at this time.

## 2015-10-19 ENCOUNTER — Encounter (HOSPITAL_COMMUNITY): Payer: Self-pay | Admitting: *Deleted

## 2015-10-19 ENCOUNTER — Inpatient Hospital Stay (HOSPITAL_COMMUNITY): Payer: BLUE CROSS/BLUE SHIELD | Admitting: Anesthesiology

## 2015-10-19 ENCOUNTER — Encounter: Payer: BLUE CROSS/BLUE SHIELD | Admitting: Advanced Practice Midwife

## 2015-10-19 ENCOUNTER — Inpatient Hospital Stay (HOSPITAL_COMMUNITY)
Admission: AD | Admit: 2015-10-19 | Discharge: 2015-10-21 | DRG: 775 | Disposition: A | Discharge status: Home / Self Care | Payer: BLUE CROSS/BLUE SHIELD | Source: Ambulatory Visit | Attending: Family Medicine | Admitting: Family Medicine

## 2015-10-19 DIAGNOSIS — O99214 Obesity complicating childbirth: Secondary | ICD-10-CM | POA: Diagnosis present

## 2015-10-19 DIAGNOSIS — Z789 Other specified health status: Secondary | ICD-10-CM

## 2015-10-19 DIAGNOSIS — Z87891 Personal history of nicotine dependence: Secondary | ICD-10-CM | POA: Diagnosis not present

## 2015-10-19 DIAGNOSIS — Z6831 Body mass index (BMI) 31.0-31.9, adult: Secondary | ICD-10-CM | POA: Diagnosis not present

## 2015-10-19 DIAGNOSIS — O99013 Anemia complicating pregnancy, third trimester: Secondary | ICD-10-CM

## 2015-10-19 DIAGNOSIS — Z3493 Encounter for supervision of normal pregnancy, unspecified, third trimester: Secondary | ICD-10-CM

## 2015-10-19 DIAGNOSIS — Z3490 Encounter for supervision of normal pregnancy, unspecified, unspecified trimester: Secondary | ICD-10-CM

## 2015-10-19 DIAGNOSIS — Z8249 Family history of ischemic heart disease and other diseases of the circulatory system: Secondary | ICD-10-CM | POA: Diagnosis not present

## 2015-10-19 DIAGNOSIS — E669 Obesity, unspecified: Secondary | ICD-10-CM | POA: Diagnosis present

## 2015-10-19 DIAGNOSIS — B951 Streptococcus, group B, as the cause of diseases classified elsewhere: Secondary | ICD-10-CM

## 2015-10-19 DIAGNOSIS — M549 Dorsalgia, unspecified: Secondary | ICD-10-CM

## 2015-10-19 DIAGNOSIS — Z3A38 38 weeks gestation of pregnancy: Secondary | ICD-10-CM | POA: Diagnosis not present

## 2015-10-19 DIAGNOSIS — O4202 Full-term premature rupture of membranes, onset of labor within 24 hours of rupture: Secondary | ICD-10-CM | POA: Diagnosis present

## 2015-10-19 DIAGNOSIS — Z3483 Encounter for supervision of other normal pregnancy, third trimester: Secondary | ICD-10-CM | POA: Diagnosis present

## 2015-10-19 DIAGNOSIS — O99824 Streptococcus B carrier state complicating childbirth: Secondary | ICD-10-CM | POA: Diagnosis present

## 2015-10-19 DIAGNOSIS — O26893 Other specified pregnancy related conditions, third trimester: Secondary | ICD-10-CM

## 2015-10-19 DIAGNOSIS — Z8759 Personal history of other complications of pregnancy, childbirth and the puerperium: Secondary | ICD-10-CM

## 2015-10-19 DIAGNOSIS — O0933 Supervision of pregnancy with insufficient antenatal care, third trimester: Secondary | ICD-10-CM

## 2015-10-19 LAB — CBC
HCT: 28.5 % — ABNORMAL LOW (ref 36.0–46.0)
Hemoglobin: 9.2 g/dL — ABNORMAL LOW (ref 12.0–15.0)
MCH: 22.7 pg — ABNORMAL LOW (ref 26.0–34.0)
MCHC: 32.3 g/dL (ref 30.0–36.0)
MCV: 70.2 fL — AB (ref 78.0–100.0)
PLATELETS: 248 10*3/uL (ref 150–400)
RBC: 4.06 MIL/uL (ref 3.87–5.11)
RDW: 16.3 % — AB (ref 11.5–15.5)
WBC: 7.8 10*3/uL (ref 4.0–10.5)

## 2015-10-19 LAB — TYPE AND SCREEN
ABO/RH(D): O POS
ANTIBODY SCREEN: NEGATIVE

## 2015-10-19 LAB — POCT FERN TEST: POCT FERN TEST: POSITIVE

## 2015-10-19 MED ORDER — ONDANSETRON HCL 4 MG PO TABS
4.0000 mg | ORAL_TABLET | ORAL | Status: DC | PRN
Start: 2015-10-19 — End: 2015-10-21

## 2015-10-19 MED ORDER — ONDANSETRON HCL 4 MG/2ML IJ SOLN: 4.0000 mg | INTRAMUSCULAR | Status: DC | PRN

## 2015-10-19 MED ORDER — OXYTOCIN 40 UNITS IN LACTATED RINGERS INFUSION - SIMPLE MED
2.5000 [IU]/h | INTRAVENOUS | Status: DC
  Filled 2015-10-19: qty 1000

## 2015-10-19 MED ORDER — AMPICILLIN SODIUM 2 G IJ SOLR
2.0000 g | Freq: Once | INTRAMUSCULAR | Status: AC
  Administered 2015-10-19: 2 g via INTRAVENOUS
  Filled 2015-10-19: qty 2000

## 2015-10-19 MED ORDER — ACETAMINOPHEN 325 MG PO TABS: 650.0000 mg | ORAL_TABLET | ORAL | Status: DC | PRN

## 2015-10-19 MED ORDER — BENZOCAINE-MENTHOL 20-0.5 % EX AERO
1.0000 "application " | INHALATION_SPRAY | CUTANEOUS | Status: DC | PRN
  Administered 2015-10-19: 1 via TOPICAL
  Filled 2015-10-19: qty 56

## 2015-10-19 MED ORDER — PENICILLIN G POTASSIUM 5000000 UNITS IJ SOLR
5.0000 10*6.[IU] | Freq: Once | INTRAVENOUS | Status: DC
  Filled 2015-10-19: qty 5

## 2015-10-19 MED ORDER — SENNOSIDES-DOCUSATE SODIUM 8.6-50 MG PO TABS
2.0000 | ORAL_TABLET | ORAL | Status: DC
  Administered 2015-10-20 (×2): 2 via ORAL
  Filled 2015-10-19 (×2): qty 2

## 2015-10-19 MED ORDER — PENICILLIN G POTASSIUM 5000000 UNITS IJ SOLR
2.5000 10*6.[IU] | INTRAVENOUS | Status: DC
  Filled 2015-10-19: qty 2.5

## 2015-10-19 MED ORDER — SOD CITRATE-CITRIC ACID 500-334 MG/5ML PO SOLN: 30.0000 mL | ORAL | Status: DC | PRN

## 2015-10-19 MED ORDER — LACTATED RINGERS IV SOLN: 500.0000 mL | INTRAVENOUS | Status: DC | PRN

## 2015-10-19 MED ORDER — LACTATED RINGERS IV SOLN: 500.0000 mL | Freq: Once | INTRAVENOUS | Status: DC

## 2015-10-19 MED ORDER — TETANUS-DIPHTH-ACELL PERTUSSIS 5-2.5-18.5 LF-MCG/0.5 IM SUSP: 0.5000 mL | Freq: Once | INTRAMUSCULAR | Status: DC

## 2015-10-19 MED ORDER — EPHEDRINE 5 MG/ML INJ
10.0000 mg | INTRAVENOUS | Status: DC | PRN
  Filled 2015-10-19: qty 4

## 2015-10-19 MED ORDER — PHENYLEPHRINE 40 MCG/ML (10ML) SYRINGE FOR IV PUSH (FOR BLOOD PRESSURE SUPPORT)
80.0000 ug | PREFILLED_SYRINGE | INTRAVENOUS | Status: DC | PRN
  Filled 2015-10-19: qty 10
  Filled 2015-10-19: qty 5

## 2015-10-19 MED ORDER — LIDOCAINE HCL (PF) 1 % IJ SOLN
30.0000 mL | INTRAMUSCULAR | Status: DC | PRN
  Filled 2015-10-19: qty 30

## 2015-10-19 MED ORDER — DIPHENHYDRAMINE HCL 50 MG/ML IJ SOLN: 12.5000 mg | INTRAMUSCULAR | Status: DC | PRN

## 2015-10-19 MED ORDER — DIBUCAINE 1 % RE OINT: 1.0000 "application " | TOPICAL_OINTMENT | RECTAL | Status: DC | PRN

## 2015-10-19 MED ORDER — WITCH HAZEL-GLYCERIN EX PADS: 1.0000 "application " | MEDICATED_PAD | CUTANEOUS | Status: DC | PRN

## 2015-10-19 MED ORDER — IBUPROFEN 600 MG PO TABS
600.0000 mg | ORAL_TABLET | Freq: Four times a day (QID) | ORAL | Status: DC
  Administered 2015-10-19 – 2015-10-21 (×8): 600 mg via ORAL
  Filled 2015-10-19 (×8): qty 1

## 2015-10-19 MED ORDER — ONDANSETRON HCL 4 MG/2ML IJ SOLN: 4.0000 mg | Freq: Four times a day (QID) | INTRAMUSCULAR | Status: DC | PRN

## 2015-10-19 MED ORDER — SIMETHICONE 80 MG PO CHEW: 80.0000 mg | CHEWABLE_TABLET | ORAL | Status: DC | PRN

## 2015-10-19 MED ORDER — ZOLPIDEM TARTRATE 5 MG PO TABS: 5.0000 mg | ORAL_TABLET | Freq: Every evening | ORAL | Status: DC | PRN

## 2015-10-19 MED ORDER — FENTANYL 2.5 MCG/ML BUPIVACAINE 1/10 % EPIDURAL INFUSION (WH - ANES)
14.0000 mL/h | INTRAMUSCULAR | Status: DC | PRN
  Administered 2015-10-19: 12 mL/h via EPIDURAL
  Filled 2015-10-19: qty 125

## 2015-10-19 MED ORDER — LIDOCAINE HCL (PF) 1 % IJ SOLN
INTRAMUSCULAR | Status: DC | PRN
  Administered 2015-10-19 (×2): 4 mL

## 2015-10-19 MED ORDER — PHENYLEPHRINE 40 MCG/ML (10ML) SYRINGE FOR IV PUSH (FOR BLOOD PRESSURE SUPPORT)
80.0000 ug | PREFILLED_SYRINGE | INTRAVENOUS | Status: DC | PRN
  Filled 2015-10-19: qty 5

## 2015-10-19 MED ORDER — OXYCODONE-ACETAMINOPHEN 5-325 MG PO TABS: 2.0000 | ORAL_TABLET | ORAL | Status: DC | PRN

## 2015-10-19 MED ORDER — OXYCODONE-ACETAMINOPHEN 5-325 MG PO TABS: 1.0000 | ORAL_TABLET | ORAL | Status: DC | PRN

## 2015-10-19 MED ORDER — OXYTOCIN BOLUS FROM INFUSION
500.0000 mL | Freq: Once | INTRAVENOUS | Status: AC
  Administered 2015-10-19: 500 mL via INTRAVENOUS

## 2015-10-19 MED ORDER — PRENATAL MULTIVITAMIN CH
1.0000 | ORAL_TABLET | Freq: Every day | ORAL | Status: DC
  Administered 2015-10-20 – 2015-10-21 (×2): 1 via ORAL
  Filled 2015-10-19 (×2): qty 1

## 2015-10-19 MED ORDER — COCONUT OIL OIL
1.0000 | TOPICAL_OIL | Status: DC | PRN
Start: 2015-10-19 — End: 2015-10-21

## 2015-10-19 MED ORDER — LACTATED RINGERS IV SOLN
INTRAVENOUS | Status: DC
Start: 2015-10-19 — End: 2015-10-19
  Administered 2015-10-19: 09:00:00 via INTRAVENOUS

## 2015-10-19 MED ORDER — DIPHENHYDRAMINE HCL 25 MG PO CAPS: 25.0000 mg | ORAL_CAPSULE | Freq: Four times a day (QID) | ORAL | Status: DC | PRN

## 2015-10-19 NOTE — Anesthesia Pain Management Evaluation Note (Signed)
  CRNA Pain Management Visit Note  Patient: Erin Johnston, 30 y.o., female  "Hello I am a member of the anesthesia team at [facility]. We have an anesthesia team available at all times to provide care throughout the hospital, including epidural management and anesthesia for C-section. I don't know your plan for the delivery whether it a natural birth, water birth, IV sedation, nitrous supplementation, doula or epidural, but we want to meet your pain goals."   1.Was your pain managed to your expectations on prior hospitalizations?   Yes   2.What is your expectation for pain management during this hospitalization?     Epidural  3.How can we help you reach that goal? Epidural  Record the patient's initial score and the patient's pain goal.   Pain: 4  Pain Goal: 6 [facility] wants you to be able to say your pain was always managed very well.  Charniece Venturino 10/19/2015

## 2015-10-19 NOTE — Anesthesia Preprocedure Evaluation (Signed)
Anesthesia Evaluation  Patient identified by MRN, date of birth, ID band Patient awake    Reviewed: Allergy & Precautions, NPO status , Patient's Chart, lab work & pertinent test results  History of Anesthesia Complications Negative for: history of anesthetic complications  Airway Mallampati: II  TM Distance: >3 FB Neck ROM: Full    Dental no notable dental hx. (+) Dental Advisory Given   Pulmonary former smoker,    Pulmonary exam normal breath sounds clear to auscultation       Cardiovascular Normal cardiovascular exam Rhythm:Regular Rate:Normal     Neuro/Psych negative neurological ROS  negative psych ROS   GI/Hepatic negative GI ROS, Neg liver ROS,   Endo/Other  negative endocrine ROSobesity  Renal/GU negative Renal ROS  negative genitourinary   Musculoskeletal negative musculoskeletal ROS (+)   Abdominal   Peds negative pediatric ROS (+)  Hematology negative hematology ROS (+)   Anesthesia Other Findings   Reproductive/Obstetrics (+) Pregnancy                             Anesthesia Physical Anesthesia Plan  ASA: II  Anesthesia Plan: Epidural   Post-op Pain Management:    Induction:   Airway Management Planned:   Additional Equipment:   Intra-op Plan:   Post-operative Plan:   Informed Consent: I have reviewed the patients History and Physical, chart, labs and discussed the procedure including the risks, benefits and alternatives for the proposed anesthesia with the patient or authorized representative who has indicated his/her understanding and acceptance.   Dental advisory given  Plan Discussed with: CRNA  Anesthesia Plan Comments:         Anesthesia Quick Evaluation

## 2015-10-19 NOTE — Anesthesia Procedure Notes (Signed)
Epidural Patient location during procedure: OB  Staffing Anesthesiologist: Vail Basista Performed: anesthesiologist   Preanesthetic Checklist Completed: patient identified, site marked, surgical consent, pre-op evaluation, timeout performed, IV checked, risks and benefits discussed and monitors and equipment checked  Epidural Patient position: sitting Prep: site prepped and draped and DuraPrep Patient monitoring: continuous pulse ox and blood pressure Approach: midline Location: L3-L4 Injection technique: LOR saline  Needle:  Needle type: Tuohy  Needle gauge: 17 G Needle length: 9 cm and 9 Needle insertion depth: 5 cm cm Catheter type: closed end flexible Catheter size: 19 Gauge Catheter at skin depth: 10 cm Test dose: negative  Assessment Events: blood not aspirated, injection not painful, no injection resistance, negative IV test and no paresthesia  Additional Notes Patient identified. Risks/Benefits/Options discussed with patient including but not limited to bleeding, infection, nerve damage, paralysis, failed block, incomplete pain control, headache, blood pressure changes, nausea, vomiting, reactions to medication both or allergic, itching and postpartum back pain. Confirmed with bedside nurse the patient's most recent platelet count. Confirmed with patient that they are not currently taking any anticoagulation, have any bleeding history or any family history of bleeding disorders. Patient expressed understanding and wished to proceed. All questions were answered. Sterile technique was used throughout the entire procedure. Please see nursing notes for vital signs. Test dose was given through epidural catheter and negative prior to continuing to dose epidural or start infusion. Warning signs of Niazi block given to the patient including shortness of breath, tingling/numbness in hands, complete motor block, or any concerning symptoms with instructions to call for help. Patient was  given instructions on fall risk and not to get out of bed. All questions and concerns addressed with instructions to call with any issues or inadequate analgesia.        

## 2015-10-19 NOTE — Anesthesia Postprocedure Evaluation (Signed)
Anesthesia Post Note  Patient: Erin Johnston  Procedure(s) Performed: * No procedures listed *  Patient location during evaluation: Mother Baby Anesthesia Type: Epidural Level of consciousness: awake and alert Pain management: satisfactory to patient Vital Signs Assessment: post-procedure vital signs reviewed and stable Respiratory status: respiratory function stable Cardiovascular status: stable Postop Assessment: no headache, no backache, epidural receding, patient able to bend at knees, no signs of nausea or vomiting and adequate PO intake Anesthetic complications: no     Last Vitals:  Vitals:   10/19/15 1555 10/19/15 1610  BP: (!) 141/90 128/84  Pulse: 84 80  Resp: 18 18  Temp: 36.8 C     Last Pain:  Vitals:   10/19/15 1752  TempSrc:   PainSc: 1    Pain Goal: Patients Stated Pain Goal: 2 (10/19/15 1752)               Karleen DolphinFUSSELL,Texie Tupou

## 2015-10-19 NOTE — MAU Note (Signed)
Water broke around 6.  Clear fluid. No bleeding. Contractions every . Was closed when last checked.

## 2015-10-19 NOTE — H&P (Signed)
LABOR AND DELIVERY ADMISSION HISTORY AND PHYSICAL NOTE  Erin CoxBrittney S Weathington is a 30 y.o. female [id number] with IUP at 3179w5d by LMP presenting for active labor.  Had contractions q115min starting at home around 5am, SROM to clear with "big gush" at 6am. Now having contractions q542min.  She reports positive fetal movement. She denies vaginal bleeding.  Prenatal History/Complications: h/o gestational HTN (not with this pregnancy, only first pregnancy)  Past Medical History: Past Medical History:  Diagnosis Date  . Hypertension    after delivery first child  . UTI (urinary tract infection)   . Vaginal Pap smear, abnormal     Past Surgical History: Past Surgical History:  Procedure Laterality Date  . CHOLECYSTECTOMY    . INDUCED ABORTION      Obstetrical History: OB History    Gravida Para Term Preterm AB Living   7 3 3   3 3    SAB TAB Ectopic Multiple Live Births   1 2 0 0 3      Social History: Social History   Social History  . Marital status: Single    Spouse name: N/A  . Number of children: N/A  . Years of education: N/A   Social History Main Topics  . Smoking status: Former Smoker    Types: Cigarettes  . Smokeless tobacco: Never Used  . Alcohol use No  . Drug use: No  . Sexual activity: Yes    Birth control/ protection: None   Other Topics Concern  . None   Social History Narrative  . None    Family History: Family History  Problem Relation Age of Onset  . Hypertension Mother     Allergies: Allergies  Allergen Reactions  . Tomato Itching    Prescriptions Prior to Admission  Medication Sig Dispense Refill Last Dose  . acetaminophen (TYLENOL) 325 MG tablet Take 650 mg by mouth every 6 (six) hours as needed for moderate pain.    Past Month at Unknown time  . cyclobenzaprine (FLEXERIL) 10 MG tablet Take 1 tablet (10 mg total) by mouth every 8 (eight) hours as needed for muscle spasms. 30 tablet 0 10/18/2015 at Unknown time  . Prenatal Multivit-Min-Fe-FA  (PRENATAL VITAMINS PO) Take 1 tablet by mouth daily.   10/17/2015     Review of Systems   All systems reviewed and negative except as stated in HPI  Blood pressure 146/84, pulse 90, temperature 99 F (37.2 C), temperature source Oral, resp. rate 20, height 5' (1.524 m), weight 73.9 kg (163 lb), last menstrual period 01/21/2015, SpO2 98 %, not currently breastfeeding. General appearance: alert, cooperative and mild distress during contractions Lungs: no respiratory distress Heart: regular rate Abdomen: soft, non-tender Extremities: No calf swelling or tenderness Presentation: cephalic by cervical exam Fetal monitoring: 130 baseline, moderate variability, +accelerations, no decelerations Uterine activity: q2-833min Dilation: 5 Effacement (%): 70 Station: -3 Exam by:: Sarajane MarekS. Carrera, RNC   Prenatal labs: ABO, Rh: O/POS/-- (08/02 0900) Antibody: NEG (08/02 0900) Rubella: 0.96 equivocal RPR: NON REAC (08/02 0900)  HBsAg: NEGATIVE (08/02 0900)  HIV: NONREACTIVE (08/02 0900)  GBS:   postive Genetic screening:  declined Anatomy US: normal  Prenatal Transfer Tool  Maternal Diabetes: No Genetic Screening: Declined Maternal Ultrasounds/Referrals: Normal Fetal Ultrasounds or other Referrals:  None Maternal Substance Abuse:  No Significant Maternal Medications:  None Significant Maternal Lab Results: Lab values include: Group B Strep positive  Results for orders placed or performed during the hospital encounter of 10/19/15 (from the past 24 hour(s))  Fern Test   Collection Time: 10/19/15  8:06 AM  Result Value Ref Range   POCT Fern Test Positive = ruptured amniotic membanes   CBC   Collection Time: 10/19/15  9:10 AM  Result Value Ref Range   WBC 7.8 4.0 - 10.5 K/uL   RBC 4.06 3.87 - 5.11 MIL/uL   Hemoglobin 9.2 (L) 12.0 - 15.0 g/dL   HCT 16.128.5 (L) 09.636.0 - 04.546.0 %   MCV 70.2 (L) 78.0 - 100.0 fL   MCH 22.7 (L) 26.0 - 34.0 pg   MCHC 32.3 30.0 - 36.0 g/dL   RDW 40.916.3 (H) 81.111.5 - 91.415.5 %    Platelets 248 150 - 400 K/uL    Patient Active Problem List   Diagnosis Date Noted  . Pregnancy 10/19/2015  . Positive GBS test 10/01/2015  . Not immune to rubella 09/30/2015  . Anemia of mother in pregnancy, antepartum 09/30/2015  . Late prenatal care affecting pregnancy, antepartum 09/29/2015  . Supervision of low-risk pregnancy 09/29/2015  . History of gestational hypertension 09/29/2015  . Pregnancy related back pain in third trimester, antepartum 09/29/2015    Assessment: Erin Johnston is a 30 y.o. [id number] at 2491w5d here for active labor  #Labor:expectant management #Pain: epidural #FWB: Category I #ID:  GBS postive #MOF: bottle #MOC:nexplanon #Circ:  Girl. N/A  Leland HerElsia J Yoo, DO PGY-1 8/22/20179:56 AM   OB FELLOW HISTORY AND PHYSICAL ATTESTATION  I have seen and examined this patient; I agree with above documentation in the resident's note.    Ernestina Pennaicholas Schenk 10/19/2015, 1:10 PM

## 2015-10-20 ENCOUNTER — Encounter: Payer: Self-pay | Admitting: *Deleted

## 2015-10-20 LAB — CBC
HCT: 27.3 % — ABNORMAL LOW (ref 36.0–46.0)
HEMOGLOBIN: 8.7 g/dL — AB (ref 12.0–15.0)
MCH: 22.2 pg — AB (ref 26.0–34.0)
MCHC: 31.9 g/dL (ref 30.0–36.0)
MCV: 69.6 fL — ABNORMAL LOW (ref 78.0–100.0)
PLATELETS: 222 10*3/uL (ref 150–400)
RBC: 3.92 MIL/uL (ref 3.87–5.11)
RDW: 16.4 % — ABNORMAL HIGH (ref 11.5–15.5)
WBC: 10.3 10*3/uL (ref 4.0–10.5)

## 2015-10-20 LAB — SYPHILIS: RPR W/REFLEX TO RPR TITER AND TREPONEMAL ANTIBODIES, TRADITIONAL SCREENING AND DIAGNOSIS ALGORITHM: RPR Ser Ql: NONREACTIVE

## 2015-10-20 MED ORDER — MEASLES, MUMPS & RUBELLA VAC ~~LOC~~ INJ
0.5000 mL | INJECTION | Freq: Once | SUBCUTANEOUS | Status: AC
  Administered 2015-10-21: 0.5 mL via SUBCUTANEOUS
  Filled 2015-10-20: qty 0.5

## 2015-10-20 MED ORDER — IBUPROFEN 600 MG PO TABS: 600.0000 mg | ORAL_TABLET | Freq: Four times a day (QID) | ORAL | 0 refills | Status: DC

## 2015-10-20 NOTE — Progress Notes (Signed)
[place] [facility]  No barriers to dc.  CPS will contact [facility] within 48 hours.    Outpatient behavioral health counseling was also made to [facility].  Counselor, Creig HinesMelissa Garrison will contact [facility] within 2 weeks.   Blaine HamperAngel Boyd-Gilyard, MSW, LCSW Clinical Social Work [phone]

## 2015-10-20 NOTE — Discharge Summary (Signed)
OB Discharge Summary     Patient Name: Erin Johnston S Langer DOB: May 17, 1985 MRN: [id number]  Date of admission: 10/19/2015 Delivering MD: Leland HerYOO, ELSIA J   Date of discharge: 10/20/2015  Admitting diagnosis: 37WKS,WATER BROKE,CTX Intrauterine pregnancy: 4554w5d     Secondary diagnosis:  Active Problems:   Normal delivery at term   Pregnancy  Additional problems: none     Discharge diagnosis: Term Pregnancy Delivered                                                                                                Post partum procedures:none  Augmentation: none  Complications: None  Hospital course:  Onset of Labor With Vaginal Delivery     30 y.o. yo [id number] at 3054w5d was admitted in Active Labor on 10/19/2015. Patient had an uncomplicated labor course as follows:  Membrane Rupture Time/Date: 6:00 AM ,10/19/2015   Intrapartum Procedures: Episiotomy: None [1]                                         Lacerations:  Labial [10]  Patient had a delivery of a Viable infant. 10/19/2015  Information for the patient's newborn:  Balling, Girl Erin Johnston [[id number]][[id number]]  Delivery Method: Vaginal, Spontaneous Delivery (Filed from Delivery Summary)    Pateint had an uncomplicated postpartum course.  She is ambulating, tolerating a regular diet, passing flatus, and urinating well. Patient is discharged home in stable condition on 10/20/15.    Physical exam Vitals:   10/19/15 1555 10/19/15 1610 10/19/15 2104 10/20/15 0616  BP: (!) 141/90 128/84 (!) 139/92 (!) 141/96  Pulse: 84 80 85 78  Resp: 18 18 16 18   Temp: 98.2 F (36.8 C)  98.2 F (36.8 C) 97.5 F (36.4 C)  TempSrc: Oral  Oral Oral  SpO2:      Weight:      Height:       General: alert, cooperative and no distress Lochia: appropriate Uterine Fundus: firm Incision: N/A DVT Evaluation: No evidence of DVT seen on physical exam. No significant calf/ankle edema. Labs: Lab Results  Component Value Date   WBC 10.3 10/20/2015   HGB 8.7 (L)  10/20/2015   HCT 27.3 (L) 10/20/2015   MCV 69.6 (L) 10/20/2015   PLT 222 10/20/2015   No flowsheet data found.  Discharge instruction: per After Visit Summary and "Baby and Me Booklet".  After visit meds:    Medication List    STOP taking these medications   cyclobenzaprine 10 MG tablet Commonly known as:  FLEXERIL     TAKE these medications   acetaminophen 325 MG tablet Commonly known as:  TYLENOL Take 650 mg by mouth every 6 (six) hours as needed for moderate pain.   ibuprofen 600 MG tablet Commonly known as:  ADVIL,MOTRIN Take 1 tablet (600 mg total) by mouth every 6 (six) hours.   PRENATAL VITAMINS PO Take 1 tablet by mouth daily.       Diet: routine diet  Activity: Advance as tolerated. Pelvic rest  for 6 weeks.   Outpatient follow up:6 weeks Follow up Appt:No future appointments. Follow up Visit:No Follow-up on file.  Postpartum contraception: Nexplanon  Newborn Data: Live born female  Birth Weight: 6 lb 2.8 oz (2800 g) APGAR: 9, 9  Baby Feeding: Bottle Disposition:home with mother   10/20/2015 Erin PennaNicholas Mayson Sterbenz, MD

## 2015-10-20 NOTE — Clinical Social Work Maternal (Addendum)
CLINICAL SOCIAL WORK MATERNAL/CHILD NOTE  Patient Details  Name: Erin Johnston MRN: [id number] Date of Birth: Sep 09, 1985  Date:  10/20/2015  Clinical Social Worker Initiating Note:  Laurey Arrow Date/ Time Initiated:  10/20/15/1000     Child's Name:  [name]   Legal Guardian:  Mother   Need for Interpreter:  None   Date of Referral:  10/19/15     Reason for Referral:  Current Substance Use/Substance Use During Pregnancy    Referral Source:      Address:  [address]. [place] [address]  Phone number:  [phone]   Household Members:  Self, Minor Children, Parents   Natural Supports (not living in the home):  Extended Family, Immediate Family, Friends, Spouse/significant other, Parent   Professional Supports: None (referral made for outpatient therapy)   Employment: Full-time   Type of Work: Banker   Education:  Database administrator Resources:  Multimedia programmer   Other Resources:      Cultural/Religious Considerations Which May Impact Care:  Per [employer] Face Sheet MOB is [name]  Strengths:  Ability to meet basic needs , Compliance with medical plan , Home prepared for child    Risk Factors/Current Problems:  Substance Use    Cognitive State:  Alert , Insightful , Linear Thinking    Mood/Affect:  Calm , Flat , Interested , Comfortable    CSW Assessment: CLINICAL SOCIAL WORK MATERNAL/CHILD NOTE  Patient Details  Name: Erin Johnston MRN: [id number] Date of Birth: March 22, 1985  Date:  10/20/2015  Clinical Social Worker Initiating Note:  Laurey Arrow                    Date/ Time Initiated:  10/20/15/1000                        Child's Name:  [name]   Legal Guardian:  Mother   Need for Interpreter:  None   Date of Referral:  10/19/15     Reason for Referral:  Current Substance Use/Substance Use During Pregnancy    Referral Source:      Address:  [address]. [place]  [address]  Phone number:  [phone]   Household Members: Self, Minor Children, Parents   Natural Supports (not living in the home): Extended Family, Immediate Family, Friends, Spouse/significant other, Artist Supports:None (referral made for outpatient therapy)   Employment:Full-time   Type of Work: Banker   Education:  Proofreader   Other Resources:     Cultural/Religious Considerations Which May Impact Care: Per MOB's Facesheet MOB is [name]  Strengths: Ability to meet basic needs , Compliance with medical plan , Home prepared for child    Risk Factors/Current Problems: Substance Use    Cognitive State: Alert , Insightful , Linear Thinking    Mood/Affect: Calm , Flat , Interested , Comfortable    CSW Assessment: CSW met with MOB to complete an assessment for hx THC use in pregnancy.  MOB was appropriate, attentive, and engaged with the baby during the session with CSW.  CSW inquired about MOB's recent loss and traumatic events.  MOB disclosed within the last month, MOB lost MOB's brother and cousin to gun violence.  MOB also reported that MOB's maternal grandfather passed within a week of MOB losing her other 2 love ones.  CSW sympathized with MOB and validated MOB's thoughts and feelings.  MOB  accepted a referral for outpatient [place] counseling.  CSW educated MOB about PPD and encouraged MOB to ask question. CSW assisted MOB with recognizing supports and interventions to decrease PPD.  CSW also encouraged MOB to seek medical attention if needed for increased signs and symptoms for PPD.  CSW also inquired about MOB's substance use.  MOB acknowledged MOB utilized marijuana during pregnancy.  MOB reported MOB last marijuana use was September 25, 2015. CSW thanked MOB for being honest, and CSW informed MOB of the hospital's drug screen policy.  MOB was informed of the 2 screenings for the  infant.  MOB was understanding and acknowledged the poor choice of using marijuana during pregnancy.   CSW shared the infant's positive UDS and communicated to MOB that CSW will make a report to CPS.  MOB stated she was not concerned, and did not have any questions about the hospital's policy. CSW offered MOB SA counseling and resources and MOB declined.  CSW thanked MOB for meeting with CSW.   CSW Plan/Description: Engineer, mining , Information/Referral to [employer] , No Further Intervention Required/No Barriers to Discharge, Child Protective Service Report  (Report made to [facility] CPS.)   Laurey Arrow, MSW, LCSW Clinical Social Work [phone]  CSW Plan/Description:  Engineer, mining , Information/Referral to [employer] , No Further Intervention Required/No Barriers to Discharge, Child Protective Service Report  (Report made to [place].)   Laurey Arrow, MSW, [employer] Clinical Social Work [phone]   Dimple Nanas, LCSW 10/20/2015, 3:18 PM

## 2015-10-20 NOTE — Progress Notes (Signed)
Post Partum Day 1  Called by RN that patient has complicated social situation with multiple recent traumatic deaths in her family. Baby staying for monitoring d/t GBS positive status. Patient asked to stay additional day in hopes that baby will able to be discharged with her tomorrow. Agreed to keep patient, plan for DC tomorrow  Leland HerElsia J Yoo PGY-1 10/20/2015, 9:39 AM   I have participated in the care of this patient and I agree with the above. Cam HaiSHAW, KIMBERLY CNM 1:11 PM 10/20/2015

## 2015-10-20 NOTE — Discharge Instructions (Signed)

## 2015-10-20 NOTE — Clinical Social Work Maternal (Signed)
  CLINICAL SOCIAL WORK MATERNAL/CHILD NOTE  Patient Details  Name: Erin Johnston MRN: [id number] Date of Birth: 1986-02-16  Date:  10/20/2015  Clinical Social Worker Initiating Note:  Erin Johnston Date/ Time Initiated:  10/20/15/1000     Child's Name:  Erin Johnston   Legal Guardian:  Mother   Need for Interpreter:  None   Date of Referral:  10/19/15     Reason for Referral:  Current Substance Use/Substance Use During Pregnancy    Referral Source:      Address:  [place]. [place] [address]  Phone number:  [phone]   Household Members:  Self, Minor Children, Parents   Natural Supports (not living in the home):  Extended Family, Immediate Family, Friends, Spouse/significant other, Parent   Professional Supports: None (referral made for outpatient therapy)   Employment: Full-time   Type of Work: Banker   Education:  Database administrator Resources:  Multimedia programmer   Other Resources:      Cultural/Religious Considerations Which May Impact Care:  Per [employer] Facesheet MOB is Erin Johnston  Strengths:  Ability to meet basic needs , Compliance with medical plan , Home prepared for child    Risk Factors/Current Problems:  Substance Use    Cognitive State:  Alert , Insightful , Linear Thinking    Mood/Affect:  Calm , Flat , Interested , Comfortable    CSW Assessment:  CSW met with MOB to complete an assessment for hx THC use in pregnancy.  MOB was appropriate, attentive, and engaged with the baby during the session with CSW.  CSW inquired about MOB's recent loss and traumatic events.  MOB disclosed within the last month, MOB lost MOB's brother and cousin to gun violence.  MOB also reported that MOB's maternal grandfather passed within a week of MOB losing her other 2 love ones.  CSW sympathized with MOB and validated MOB's thoughts and feelings.  MOB accepted a referral for outpatient Erin Johnston counseling.  CSW educated MOB about PPD and  encouraged MOB to ask question. CSW assisted MOB with recognizing supports and interventions to decrease PPD.  CSW also encouraged MOB to seek medical attention if needed for increased signs and symptoms for PPD.  CSW also inquired about MOB's substance use.  MOB acknowledged MOB utilized marijuana during pregnancy.  MOB reported MOB last marijuana use was September 25, 2015. CSW thanked MOB for being honest, and CSW informed MOB of the hospital's drug screen policy.  MOB was informed of the 2 screenings for the infant.  MOB was understanding and acknowledged the poor choice of using marijuana during pregnancy.   CSW shared the infant's positive UDS and communicated to MOB that CSW will make a report to CPS.  MOB stated she was not concerned, and did not have any questions about the hospital's policy. CSW offered MOB SA counseling and resources and MOB declined.  CSW thanked MOB for meeting with CSW.   CSW Plan/Description:  Engineer, mining , Information/Referral to [employer] , No Further Intervention Required/No Barriers to Discharge, Child Protective Service Report  (Report made to [place].)   Erin Johnston, MSW, [employer] Clinical Social Work [phone]   Erin Nanas, LCSW 10/20/2015, 12:34 PM

## 2015-10-20 NOTE — Progress Notes (Signed)
Post Partum Day 1 Subjective: no complaints, up ad lib, voiding, tolerating PO (ate dinner last night post birth), + flatus and feels much better than yesterday. She reports her cramping responds well to the ibuprofen. She reports no headache or changes in vision.  Objective: Blood pressure (!) 141/96, pulse 78, temperature 97.5 F (36.4 C), temperature source Oral, resp. rate 18, height 5' (1.524 m), weight 73.9 kg (163 lb), last menstrual period 01/21/2015, SpO2 99 %, unknown if currently breastfeeding.  Physical Exam:  General: alert, cooperative and no distress Lochia: appropriate Uterine Fundus: firm DVT Evaluation: No evidence of DVT seen on physical exam.   Recent Labs  10/19/15 0910 10/20/15 0538  HGB 9.2* 8.7*  HCT 28.5* 27.3*    Assessment/Plan: Discharge home, needs MMR before leaving.    LOS: 1 day   Baxter Kail 10/20/2015, 7:35 AM   OB FELLOW MEDICAL STUDENT NOTE ATTESTATION  I have seen and examined this patient. Note this is a Careers information officer note and as such does not necessarily reflect the patient's plan of care. Please see Jacquiline Doe MD's note for this date of service.    Jacquiline Doe 10/20/2015, 7:52 AM
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
## 2015-10-21 NOTE — Plan of Care (Signed)
Problem: Health Behavior/Discharge Planning: Goal: Ability to manage health-related needs will improve Outcome: Progressing Mother talked at length about the loss of her brother and grandfather and the emotional toll it has taken upon herself and her family. Emotional support offered. When discussing problems to notify a health provider about, I reviewed both postpartum depression and anxiety and the baby blues with both [place] and the FOB. We acknowledged their increased risk/vunerability for postpartum depression and/or anxiety.  Patient acknowledged that the Social Worker had given her resources to contact should she find herself in need of support.

## 2015-10-21 NOTE — Discharge Summary (Signed)
OB Discharge Summary     Patient Name: Erin Johnston DOB: April 25, 1985 MRN: [id number]  Date of admission: 10/19/2015 Delivering MD: Bufford Lope   Date of discharge: 10/21/2015  Admitting diagnosis: 37WKS,WATER BROKE,CTX Intrauterine pregnancy: [redacted]w[redacted]d    Secondary diagnosis:  Active Problems:   Normal delivery at term   Pregnancy  Additional problems: h/o gestational HTN (not with this pregnancy, only first pregnancy)     Discharge diagnosis: Term Pregnancy Delivered                                                                                                Post partum procedures:MMR vaccination  Augmentation: none  Complications: None  Hospital course:  Onset of Labor With Vaginal Delivery     30y.o. yo [id number] admitted in Active Labor on 10/19/2015. Patient had an uncomplicated labor course as follows:  Membrane Rupture Time/Date: 6:00 AM ,10/19/2015   Intrapartum Procedures: Episiotomy: None [1]                                         Lacerations:  Labial [10]  Patient had a delivery of a Viable infant. 10/19/2015  Information for the patient's newborn:  Wetherell, Girl BrShyanna0[[id number]]Delivery Method: Vaginal, Spontaneous Delivery (Filed from Delivery Summary)    Pateint had an uncomplicated postpartum course.  She is ambulating, tolerating a regular diet, passing flatus, and urinating well. Patient is discharged home in stable condition on 10/21/15.    Physical exam Vitals:   10/20/15 0740 10/20/15 1821 10/20/15 1858 10/21/15 0513  BP: 132/86 (!) 139/92 134/82 111/85  Pulse: 84 81 82 93  Resp: '18 17 18 18  ' Temp: 98 F (36.7 C) 98.4 F (36.9 C)  98 F (36.7 C)  TempSrc: Oral Oral    SpO2:      Weight:      Height:       General: alert, cooperative and no distress Lochia: appropriate Uterine Fundus: firm DVT Evaluation: No evidence of DVT seen on physical exam. Negative Homan's sign. Labs: Lab Results  Component Value Date   WBC 10.3  10/20/2015   HGB 8.7 (L) 10/20/2015   HCT 27.3 (L) 10/20/2015   MCV 69.6 (L) 10/20/2015   PLT 222 10/20/2015   No flowsheet data found.  Discharge instruction: per After Visit Summary and "Baby and Me Booklet".  After visit meds:    Medication List    STOP taking these medications   cyclobenzaprine 10 MG tablet Commonly known as:  FLEXERIL     TAKE these medications   acetaminophen 325 MG tablet Commonly known as:  TYLENOL Take 650 mg by mouth every 6 (six) hours as needed for moderate pain.   ibuprofen 600 MG tablet Commonly known as:  ADVIL,MOTRIN Take 1 tablet (600 mg total) by mouth every 6 (six) hours.   PRENATAL VITAMINS PO Take 1 tablet by mouth daily.       Diet: routine diet  Activity:  Advance as tolerated. Pelvic rest for 6 weeks.   Outpatient follow up:6 weeks Follow up Appt: Future Appointments Date Time Provider [place]  12/06/2015 1:40 PM Waldemar Dickens, MD [place] [facility]   Follow up Visit: [place] for [place] Follow up in 6 week(s).   Specialty:  Obstetrics and Gynecology Why:  for postpartum visit Contact information: [employer] [place] [phone]       [place] .   Why:  as needed for any potential emergencies Contact information: [place] [address] [phone]         Postpartum contraception: Nexplanon  MMR prior to discharge.  Newborn Data: Live born female  Birth Weight: 6 lb 2.8 oz (2800 g) APGAR: 9, 9  Baby Feeding: Bottle Disposition:home with mother   10/21/2015 Bufford Lope, DO PGY-1  OB FELLOW DISCHARGE ATTESTATION  I have seen and examined this patient and agree with above documentation in the resident's note.   Jacquiline Doe, MD 10:00 AM

## 2015-10-22 NOTE — Progress Notes (Signed)
CSW reviewed cord screen results and forwarded positive results to [place] CPS.  Tinea Nobile Boyd-Gilyard, MSW, LCSW Clinical Social Work [phone] 

## 2015-11-10 ENCOUNTER — Telehealth: Payer: Self-pay

## 2015-11-10 NOTE — Telephone Encounter (Signed)
Msg is for [place],  Pt dropped off forms to be filled out with additional info needed on the sticky note.  Please advise   [phone]

## 2015-11-15 NOTE — Telephone Encounter (Signed)
Patient is calling to see if the form has already been faxed. Patient states that she wrote down the wrong fax number and needs us to send fax it to the number that's on the from.  [phone]

## 2015-11-19 NOTE — Telephone Encounter (Signed)
This patients disability forms were sent to the scan center instead of processed in house by the disability department and were not faxed off before they were sent.   The scan center has them scanned under the media tab, I printed the forms out and faxed them to the patients employer on 11/19/15.

## 2015-11-19 NOTE — Telephone Encounter (Signed)
I have signed the form and filled it out as much as I can.  I don't know if there were complications or why the leave should be extended as I have not seen her back after the baby was born.  Please discuss with patient.  This would have been best done by her OB/GYN. Deliah BostonMichael Ermon Sagan, MS, PA-C 7:38 PM, 11/19/2015

## 2015-11-19 NOTE — Telephone Encounter (Signed)
Have these forms been completed? I have not seen any disability forms but the patient has called and left me 3 voicemail's stating that she needs these forms completed and faxed to [phone].  Can someone please check on this and either get the forms back to me so that they can be completed per the sticky note that was left on them or let me know something so I can contact this patient and let her know why it has taken over the 7 business days to get this handled. Thank you

## 2015-12-06 ENCOUNTER — Encounter: Payer: Self-pay | Admitting: Obstetrics and Gynecology

## 2015-12-06 ENCOUNTER — Encounter: Payer: Self-pay | Admitting: Family Medicine

## 2015-12-06 ENCOUNTER — Ambulatory Visit (INDEPENDENT_AMBULATORY_CARE_PROVIDER_SITE_OTHER): Payer: BLUE CROSS/BLUE SHIELD | Admitting: Obstetrics and Gynecology

## 2015-12-06 DIAGNOSIS — Z30017 Encounter for initial prescription of implantable subdermal contraceptive: Secondary | ICD-10-CM

## 2015-12-06 LAB — POCT PREGNANCY, URINE: Preg Test, Ur: NEGATIVE

## 2015-12-06 MED ORDER — ETONOGESTREL 68 MG ~~LOC~~ IMPL
68.0000 mg | DRUG_IMPLANT | Freq: Once | SUBCUTANEOUS | Status: AC
  Administered 2015-12-06: 68 mg via SUBCUTANEOUS

## 2015-12-06 NOTE — Patient Instructions (Addendum)
Etonogestrel implant What is this medicine? ETONOGESTREL (et oh noe JES trel) is a contraceptive (birth control) device. It is used to prevent pregnancy. It can be used for up to 3 years. This medicine may be used for other purposes; ask your health care provider or pharmacist if you have questions. What should I tell my health care provider before I take this medicine? They need to know if you have any of these conditions: -abnormal vaginal bleeding -blood vessel disease or blood clots -cancer of the breast, cervix, or liver -depression -diabetes -gallbladder disease -headaches -heart disease or recent heart attack -Lease blood pressure -[place] cholesterol -kidney disease -liver disease -renal disease -seizures -tobacco smoker -an unusual or allergic reaction to etonogestrel, other hormones, anesthetics or antiseptics, medicines, foods, dyes, or preservatives -pregnant or trying to get pregnant -breast-feeding How should I use this medicine? This device is inserted just under the skin on the inner side of your upper arm by a health care professional. Talk to your pediatrician regarding the use of this medicine in children. Special care may be needed. Overdosage: If you think you have taken too much of this medicine contact a poison control center or emergency room at once. NOTE: This medicine is only for you. Do not share this medicine with others. What if I miss a dose? This does not apply. What may interact with this medicine? Do not take this medicine with any of the following medications: -amprenavir -bosentan -fosamprenavir This medicine may also interact with the following medications: -barbiturate medicines for inducing sleep or treating seizures -certain medicines for fungal infections like ketoconazole and itraconazole -griseofulvin -medicines to treat seizures like carbamazepine, felbamate, oxcarbazepine, phenytoin,  topiramate -modafinil -phenylbutazone -rifampin -some medicines to treat HIV infection like atazanavir, indinavir, lopinavir, nelfinavir, tipranavir, ritonavir -St. John's wort This list may not describe all possible interactions. Give your health care provider a list of all the medicines, herbs, non-prescription drugs, or dietary supplements you use. Also tell them if you smoke, drink alcohol, or use illegal drugs. Some items may interact with your medicine. What should I watch for while using this medicine? This product does not protect you against HIV infection (AIDS) or other sexually transmitted diseases. You should be able to feel the implant by pressing your fingertips over the skin where it was inserted. Contact your doctor if you cannot feel the implant, and use a non-hormonal birth control method (such as condoms) until your doctor confirms that the implant is in place. If you feel that the implant may have broken or become bent while in your arm, contact your healthcare provider. What side effects may I notice from receiving this medicine? Side effects that you should report to your doctor or health care professional as soon as possible: -allergic reactions like skin rash, itching or hives, swelling of the face, lips, or tongue -breast lumps -changes in emotions or moods -depressed mood -heavy or prolonged menstrual bleeding -pain, irritation, swelling, or bruising at the insertion site -scar at site of insertion -signs of infection at the insertion site such as fever, and skin redness, pain or discharge -signs of pregnancy -signs and symptoms of a blood clot such as breathing problems; changes in vision; chest pain; severe, sudden headache; pain, swelling, warmth in the leg; trouble speaking; sudden numbness or weakness of the face, arm or leg -signs and symptoms of liver injury like dark yellow or brown urine; general ill feeling or flu-like symptoms; light-colored stools; loss of  appetite; nausea; right upper  belly pain; unusually weak or tired; yellowing of the eyes or skin -unusual vaginal bleeding, discharge -signs and symptoms of a stroke like changes in vision; confusion; trouble speaking or understanding; severe headaches; sudden numbness or weakness of the face, arm or leg; trouble walking; dizziness; loss of balance or coordination Side effects that usually do not require medical attention (Report these to your doctor or health care professional if they continue or are bothersome.): -acne -back pain -breast pain -changes in weight -dizziness -general ill feeling or flu-like symptoms -headache -irregular menstrual bleeding -nausea -sore throat -vaginal irritation or inflammation This list may not describe all possible side effects. Call your doctor for medical advice about side effects. You may report side effects to FDA at 1-800-FDA-1088. Where should I keep my medicine? This drug is given in a hospital or clinic and will not be stored at home. NOTE: This sheet is a summary. It may not cover all possible information. If you have questions about this medicine, talk to your doctor, pharmacist, or health care provider.    2016, Elsevier/Gold Standard. (2013-11-28 14:07:06) Urinary Incontinence Urinary incontinence is the involuntary loss of urine from your bladder. CAUSES  There are many causes of urinary incontinence. They include:  Medicines.  Infections.  Prostatic enlargement, leading to overflow of urine from your bladder.  Surgery.  Neurological diseases.  Emotional factors. SIGNS AND SYMPTOMS Urinary Incontinence can be divided into four types: 1. Urge incontinence. Urge incontinence is the involuntary loss of urine before you have the opportunity to go to the bathroom. There is a sudden urge to void but not enough time to reach a bathroom. 2. Stress incontinence. Stress incontinence is the sudden loss of urine with any activity that  forces urine to pass. It is commonly caused by anatomical changes to the pelvis and sphincter areas of your body. 3. Overflow incontinence. Overflow incontinence is the loss of urine from an obstructed opening to your bladder. This results in a backup of urine and a resultant buildup of pressure within the bladder. When the pressure within the bladder exceeds the closing pressure of the sphincter, the urine overflows, which causes incontinence, similar to water overflowing a dam. 4. Total incontinence. Total incontinence is the loss of urine as a result of the inability to store urine within your bladder. DIAGNOSIS  Evaluating the cause of incontinence may require:  A thorough and complete medical and obstetric history.  A complete physical exam.  Laboratory tests such as a urine culture and sensitivities. When additional tests are indicated, they can include:  An ultrasound exam.  Kidney and bladder X-rays.  Cystoscopy. This is an exam of the bladder using a narrow scope.  Urodynamic testing to test the nerve function to the bladder and sphincter areas. TREATMENT  Treatment for urinary incontinence depends on the cause:  For urge incontinence caused by a bacterial infection, antibiotics will be prescribed. If the urge incontinence is related to medicines you take, your health care provider may have you change the medicine.  For stress incontinence, surgery to re-establish anatomical support to the bladder or sphincter, or both, will often correct the condition.  For overflow incontinence caused by an enlarged prostate, an operation to open the channel through the enlarged prostate will allow the flow of urine out of the bladder. In women with fibroids, a hysterectomy may be recommended.  For total incontinence, surgery on your urinary sphincter may help. An artificial urinary sphincter (an inflatable cuff placed around the urethra) may be  required. In women who have developed a  hole-like passage between their bladder and vagina (vesicovaginal fistula), surgery to close the fistula often is required. HOME CARE INSTRUCTIONS  Normal daily hygiene and the use of pads or adult diapers that are changed regularly will help prevent odors and skin damage.  Avoid caffeine. It can overstimulate your bladder.  Use the bathroom regularly. Try about every 2-3 hours to go to the bathroom, even if you do not feel the need to do so. Take time to empty your bladder completely. After urinating, wait a minute. Then try to urinate again.  For causes involving nerve dysfunction, keep a log of the medicines you take and a journal of the times you go to the bathroom. SEEK MEDICAL CARE IF:  You experience worsening of pain instead of improvement in pain after your procedure.  Your incontinence becomes worse instead of better. SEE IMMEDIATE MEDICAL CARE IF:  You experience fever or shaking chills.  You are unable to pass your urine.  You have redness spreading into your groin or down into your thighs. MAKE SURE YOU:   Understand these instructions.   Will watch your condition.  Will get help right away if you are not doing well or get worse.   This information is not intended to replace advice given to you by your health care provider. Make sure you discuss any questions you have with your health care provider.   Document Released: 03/23/2004 Document Revised: 03/06/2014 Document Reviewed: 07/23/2012 Elsevier Interactive Patient Education [employer]

## 2015-12-06 NOTE — Progress Notes (Signed)
Subjective:     Erin CoxBrittney S Pembleton is a 30 y.o. female who presents for a postpartum visit. She is 7 weeks postpartum following a spontaneous vaginal delivery. I have fully reviewed the prenatal and intrapartum course. The delivery was at 39 gestational weeks. Outcome: spontaneous vaginal delivery. Anesthesia: epidural. Postpartum course has been unremarkable. Baby's course has been unremarkable. Baby is feeding by bottle - Similac Advance. Bleeding no bleeding. Bowel function is normal. Bladder function is abnormal: stress incontinence. Patient is sexually active. Contraception method is condoms. Postpartum depression screening: negative.    Review of Systems  Constitutional: Negative.   Eyes: Negative.   Respiratory: Negative.   Cardiovascular: Negative.   Gastrointestinal: Negative.   Genitourinary: Negative.   Musculoskeletal: Negative.   Skin: Negative.   Neurological: Negative.   Endo/Heme/Allergies: Negative.   Psychiatric/Behavioral: Negative.     Objective:    BP 123/70   Pulse 74   Wt 148 lb 12.8 oz (67.5 kg)   LMP 11/27/2015 (Exact Date)   Breastfeeding? No   BMI 29.06 kg/m   General:  alert and cooperative   Breasts:  inspection negative, no nipple discharge or bleeding, no masses or nodularity palpable  Lungs: clear to auscultation bilaterally  Heart:  regular rate and rhythm, S1, S2 normal, no murmur, click, rub or gallop  Abdomen: soft, non-tender; bowel sounds normal; no masses,  no organomegaly   Vulva:  not evaluated  Vagina: not evaluated  Cervix:  not examined  Corpus: not examined  Adnexa:  not evaluated  Rectal Exam: Not evaluated        Assessment:    Normal postpartum exam. Doing well with help at home from mother, grandmother and FOB.   Plan:    1. Contraception: Nexplanon 2. Encouraged core strengthen for stress incontinence 3. Follow up in: 1 year or as needed.

## 2015-12-06 NOTE — Progress Notes (Signed)
PRE-OP DIAGNOSIS: desired long-term, reversible contraception   POST-OP DIAGNOSIS: Same   PROCEDURE: Nexplanon  placement  Performing Physician: Ernestina PennaNicholas Takayla Baillie, MD   PROCEDURE:  Urine pregnancy test: negative Written informed consent obtained Site (check): left arm        Sterile Preparation:    [x_]      Betadine        [_]     Chloraprep             After a time-out, insertion site was selected 6 - 10 cm from medial epicondyle and marked. Procedure area was prepped with betadine. 3 mL of 1% lidocaine without epinephrine was used for subcutaneous anesthesia. Anesthesia confirmed.  Nexplanon  trocar was inserted subcutaneously and then Nexplanon  capsule delivered subcutaneously. Trocar was removed from the insertion site. Nexplanon  capsule was palpated by provider and patient to assure satisfactory placement.  Estimated blood loss: minimal Dressings applied: 4x4 with kelex pressure dressing Followup: The patient tolerated the procedure well without complications.  Standard post-procedure care wass explained and return precautions were given.
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
## 2016-06-05 ENCOUNTER — Ambulatory Visit (HOSPITAL_COMMUNITY)
Admission: EM | Admit: 2016-06-05 | Discharge: 2016-06-05 | Disposition: A | Discharge status: Home / Self Care | Payer: BLUE CROSS/BLUE SHIELD | Attending: Internal Medicine | Admitting: Internal Medicine

## 2016-06-05 ENCOUNTER — Encounter (HOSPITAL_COMMUNITY): Payer: Self-pay | Admitting: Emergency Medicine

## 2016-06-05 DIAGNOSIS — K029 Dental caries, unspecified: Secondary | ICD-10-CM

## 2016-06-05 DIAGNOSIS — K0889 Other specified disorders of teeth and supporting structures: Secondary | ICD-10-CM

## 2016-06-05 MED ORDER — IBUPROFEN 800 MG PO TABS: 800.0000 mg | ORAL_TABLET | Freq: Three times a day (TID) | ORAL | 0 refills | Status: DC

## 2016-06-05 MED ORDER — AMOXICILLIN 875 MG PO TABS
875.0000 mg | ORAL_TABLET | Freq: Two times a day (BID) | ORAL | 0 refills | Status: DC
Start: 2016-06-05 — End: 2021-09-12

## 2016-06-05 MED ORDER — KETOROLAC TROMETHAMINE 60 MG/2ML IM SOLN
60.0000 mg | Freq: Once | INTRAMUSCULAR | Status: AC
  Administered 2016-06-05: 60 mg via INTRAMUSCULAR

## 2016-06-05 MED ORDER — KETOROLAC TROMETHAMINE 60 MG/2ML IM SOLN
INTRAMUSCULAR | Status: AC
Start: 2016-06-05 — End: 2016-06-05
  Filled 2016-06-05: qty 2

## 2016-06-05 NOTE — ED Provider Notes (Signed)
CSN: [id number]     Arrival date & time 06/05/16  1731 History   First MD Initiated Contact with Patient 06/05/16 1840     Chief Complaint  Patient presents with  . Dental Pain   (Consider location/radiation/quality/duration/timing/severity/associated sxs/prior Treatment) Patient c/o left upper molar tenderness.  She states she has a dental carie and is scheduled to follow up with her dentist in a a week.   The history is provided by the patient.  Dental Pain  Location:  Upper Upper teeth location:  16/LU 3rd molar Quality:  Aching Severity:  Moderate Onset quality:  Sudden Duration:  1 week Timing:  Constant Progression:  Worsening Chronicity:  New Context: dental caries   Previous work-up:  Dental exam Relieved by:  Nothing Worsened by:  Nothing Ineffective treatments:  None tried Associated symptoms: no congestion     Past Medical History:  Diagnosis Date  . Hypertension    after delivery first child  . UTI (urinary tract infection)   . Vaginal Pap smear, abnormal    Past Surgical History:  Procedure Laterality Date  . CHOLECYSTECTOMY    . INDUCED ABORTION     Family History  Problem Relation Age of Onset  . Hypertension Mother    Social History  Substance Use Topics  . Smoking status: Former Smoker    Types: Cigarettes  . Smokeless tobacco: Never Used  . Alcohol use No   OB History    Gravida Para Term Preterm AB Living   SAB TAB Ectopic Multiple Live Births   1 2 0 0 4     Review of Systems  Constitutional: Negative.   HENT: Positive for dental problem. Negative for congestion.   Eyes: Negative.   Respiratory: Negative.   Cardiovascular: Negative.   Gastrointestinal: Negative.   Endocrine: Negative.   Genitourinary: Negative.   Musculoskeletal: Negative.   Allergic/Immunologic: Negative.   Neurological: Negative.   Hematological: Negative.   Psychiatric/Behavioral: Negative.     Allergies  Tomato  Home Medications    Prior to Admission medications   Medication Sig Start Date End Date Taking? Authorizing Provider  ibuprofen (ADVIL,MOTRIN) 600 MG tablet Take 1 tablet (600 mg total) by mouth every 6 (six) hours. 10/20/15  Yes Lorne Skeens, MD  amoxicillin (AMOXIL) 875 MG tablet Take 1 tablet (875 mg total) by mouth 2 (two) times daily. 06/05/16   Deatra Canter, FNP  ibuprofen (ADVIL,MOTRIN) 800 MG tablet Take 1 tablet (800 mg total) by mouth 3 (three) times daily. 06/05/16   Deatra Canter, FNP   Meds Ordered and Administered this Visit   Medications  ketorolac (TORADOL) injection 60 mg (not administered)    BP (!) 159/108 (BP Location: Right Arm) Comment: notified cma  Pulse 74   Temp 98.5 F (36.9 C) (Oral)   Resp 14   SpO2 98%  No data found.   Physical Exam  Constitutional: She appears well-developed and well-nourished.  HENT:  Head: Normocephalic and atraumatic.  Right Ear: External ear normal.  Left Ear: External ear normal.  Mouth/Throat: Oropharynx is clear and moist.  Eyes: Conjunctivae and EOM are normal. Pupils are equal, round, and reactive to light.  Neck: Normal range of motion. Neck supple.  Cardiovascular: Normal rate, regular rhythm and normal heart sounds.   Pulmonary/Chest: Effort normal and breath sounds normal.  Abdominal: Soft. Bowel sounds are normal.  Nursing note and vitals reviewed.   Urgent Care Course  Procedures (including critical care time)  Labs Review Labs Reviewed - No data to display  Imaging Review No results found.   Visual Acuity Review  Right Eye Distance:   Left Eye Distance:   Bilateral Distance:    Right Eye Near:   Left Eye Near:    Bilateral Near:         MDM   1. Pain, dental   2. Dental caries    Amoxicillin  one po bid x 7 days #14 Motrin  one po tid #30 Toradol  IM      Deatra Canter, FNP 06/05/16 1846

## 2016-06-05 NOTE — ED Triage Notes (Signed)
The patient presented to the [facility] with a complaint of dental pain x 1 month. 

## 2016-08-29 ENCOUNTER — Encounter: Payer: Self-pay | Admitting: Obstetrics and Gynecology

## 2016-09-02 ENCOUNTER — Ambulatory Visit (INDEPENDENT_AMBULATORY_CARE_PROVIDER_SITE_OTHER): Payer: BLUE CROSS/BLUE SHIELD | Admitting: Physician Assistant

## 2016-09-02 ENCOUNTER — Encounter: Payer: Self-pay | Admitting: Physician Assistant

## 2016-09-02 VITALS — BP 119/81 | HR 82 | Resp 16 | Ht 60.0 in | Wt 144.2 lb

## 2016-09-02 DIAGNOSIS — N920 Excessive and frequent menstruation with regular cycle: Secondary | ICD-10-CM

## 2016-09-02 DIAGNOSIS — N949 Unspecified condition associated with female genital organs and menstrual cycle: Secondary | ICD-10-CM | POA: Diagnosis not present

## 2016-09-02 LAB — POCT CBC
Granulocyte percent: 60.4 %G (ref 37–80)
HEMATOCRIT: 42.2 % (ref 37.7–47.9)
Hemoglobin: 13.9 g/dL (ref 12.2–16.2)
LYMPH, POC: 2.7 (ref 0.6–3.4)
MCH, POC: 28.5 pg (ref 27–31.2)
MCHC: 33 g/dL (ref 31.8–35.4)
MCV: 86.2 fL (ref 80–97)
MID (cbc): 0.5 (ref 0–0.9)
MPV: 8.5 fL (ref 0–99.8)
POC GRANULOCYTE: 4.8 (ref 2–6.9)
POC LYMPH %: 33.6 % (ref 10–50)
POC MID %: 6 % (ref 0–12)
Platelet Count, POC: 266 10*3/uL (ref 142–424)
RBC: 4.89 M/uL (ref 4.04–5.48)
RDW, POC: 15.7 %
WBC: 8 10*3/uL (ref 4.6–10.2)

## 2016-09-02 LAB — POCT URINALYSIS DIP (MANUAL ENTRY)
BILIRUBIN UA: NEGATIVE
Blood, UA: NEGATIVE
Glucose, UA: NEGATIVE mg/dL
LEUKOCYTES UA: NEGATIVE
Nitrite, UA: NEGATIVE
PH UA: 7 (ref 5.0–8.0)
Protein Ur, POC: NEGATIVE mg/dL
Spec Grav, UA: 1.025 (ref 1.010–1.025)
Urobilinogen, UA: 0.2 E.U./dL

## 2016-09-02 LAB — POCT WET + KOH PREP
TRICH BY WET PREP: ABSENT
YEAST BY KOH: ABSENT
Yeast by wet prep: ABSENT

## 2016-09-02 LAB — POCT URINE PREGNANCY: Preg Test, Ur: NEGATIVE

## 2016-09-02 MED ORDER — DOXYCYCLINE HYCLATE 100 MG PO CAPS: 100.0000 mg | ORAL_CAPSULE | Freq: Two times a day (BID) | ORAL | 0 refills | Status: AC

## 2016-09-02 MED ORDER — METRONIDAZOLE 500 MG PO TABS: 500.0000 mg | ORAL_TABLET | Freq: Two times a day (BID) | ORAL | 0 refills | Status: DC

## 2016-09-02 MED ORDER — CEFTRIAXONE SODIUM 250 MG IJ SOLR
250.0000 mg | Freq: Once | INTRAMUSCULAR | Status: AC
  Administered 2016-09-02: 250 mg via INTRAMUSCULAR

## 2016-09-02 NOTE — Progress Notes (Signed)
[facility] [address], [place] [id number] [phone]  Date:  09/02/2016   Name:  Erin CoxBrittney S Johnston   DOB:  [birth year]   MRN:  [id number]  PCP:  Patient, No Pcp Per    History of Present Illness:  Erin Johnston is a 31 y.o. female patient who presents to PCP with  Chief Complaint  Patient presents with  . Contraception    patient had Nexplanon placed appx 9 months ago, has had severe abdominal cramping and very heavy flow since; abnormal cycle     She is concerned of significant cramping that is worsened with periods.  She has heavy bleeding, with  8 hours, 3 pads.  She has a hx of heavy bleeding and cramping.  Bleeding 1 month after her period then the period returns.  She will have no bleeding for 1 week, then the bleeding will start again.   Sexually active 5 times with her partner since 9 months ago, due to bleeding--she endorses dyspareunia with activity.   4 children--10,9, 2, 10months.  Vaginal deliveries.   1 pap smear abnormal 2-3 years ago with chlamydia.   No abnormal vaginal discharge.     She has some fatigue and "always tired".   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   She has bowel movement twice per day following the removal of her gallbladder.     9 months ago, had nexplanon   Patient Active Problem List   Diagnosis Date Noted  . Pregnancy 10/19/2015  . Positive GBS test 10/01/2015  . Not immune to rubella 09/30/2015  . Anemia of mother in pregnancy, antepartum 09/30/2015  . Late prenatal care affecting pregnancy, antepartum 09/29/2015  . Normal delivery at term 09/29/2015  . History of gestational hypertension 09/29/2015  . Pregnancy related back pain in third trimester, antepartum 09/29/2015    Past Medical History:  Diagnosis Date  . Hypertension    after delivery first child  . UTI (urinary tract infection)   . Vaginal Pap smear, abnormal      Past Surgical History:  Procedure Laterality Date  . CHOLECYSTECTOMY    . INDUCED ABORTION      Social History  Substance Use Topics  . Smoking status: Former Smoker    Types: Cigarettes  . Smokeless tobacco: Never Used  . Alcohol use No    Family History  Problem Relation Age of Onset  . Hypertension Mother     Allergies  Allergen Reactions  . Tomato Itching    Medication list has been reviewed and updated.  Current Outpatient Prescriptions on File Prior to Visit  Medication Sig Dispense Refill  . amoxicillin (AMOXIL) 875 MG tablet Take 1 tablet (875 mg total) by mouth 2 (two) times daily. (Patient not taking: Reported on 09/02/2016) 14 tablet 0  . ibuprofen (ADVIL,MOTRIN) 600 MG tablet Take 1 tablet (600 mg total) by mouth every 6 (six) hours. (Patient not taking: Reported on 09/02/2016) 30 tablet 0  . ibuprofen (ADVIL,MOTRIN) 800 MG tablet Take 1 tablet (800 mg total) by mouth 3 (three) times daily. (Patient not taking: Reported on 09/02/2016) 21 tablet 0   No current facility-administered medications on file prior to visit.     ROS ROS otherwise unremarkable unless listed above.  Physical Examination: BP 119/81 (BP Location: Right Arm, Patient Position: Sitting, Cuff Size: Normal)   Pulse 82  Resp 16   Ht 5' (1.524 m)   Wt 144 lb 3.2 oz (65.4 kg)   SpO2 100%   BMI 28.16 kg/m  Ideal Body Weight: Weight in (lb) to have BMI = 25: 127.7  Physical Exam  Constitutional: She is oriented to person, place, and time. She appears well-developed and well-nourished. No distress.  HENT:  Head: Normocephalic and atraumatic.  Right Ear: External ear normal.  Left Ear: External ear normal.  Eyes: Conjunctivae and EOM are normal. Pupils are equal, round, and reactive to light.  Cardiovascular: Normal rate.   Pulmonary/Chest: Effort normal. No respiratory distress.  Genitourinary: Pelvic exam was performed with patient supine. There is no rash or tenderness on the right  labia. There is no rash or tenderness on the left labia.  Genitourinary Comments: Cervix appears anti-verted and posterior.  Once located she has extreme tenderness to palpation, though cervix is normal appearing.    Neurological: She is alert and oriented to person, place, and time.  Skin: She is not diaphoretic.  Psychiatric: She has a normal mood and affect. Her behavior is normal.     Assessment and Plan: Erin Johnston is a 31 y.o. female who is here today for cc of abnormal vaginal bleeding Secondary to the nexplanon, vs pid vs endometriosis, etc. Treating for pid.  Doxycycline may also help to clear her symptoms.   Menorrhagia with regular cycle - Plan: POCT CBC, POCT Wet + KOH Prep, POCT urinalysis dipstick, POCT urine pregnancy, GC/Chlamydia Probe Amp, cefTRIAXone (ROCEPHIN) injection 250 mg, CANCELED: GC/Chlamydia Probe Amp  CMT (cervical motion tenderness) - Plan: GC/Chlamydia Probe Amp, cefTRIAXone (ROCEPHIN) injection 250 mg  Trena Platt, PA-C [facility] 7/9/20188:44 AM

## 2016-09-02 NOTE — Patient Instructions (Signed)
Please return to your gynecologist as soon as you can get an appointment. It is recommended that you get an ultrasound, and can be performed with your specialist.   Please take antibiotic as prescribed.

## 2016-09-04 ENCOUNTER — Telehealth: Payer: Self-pay | Admitting: Physician Assistant

## 2016-09-04 DIAGNOSIS — Z0271 Encounter for disability determination: Secondary | ICD-10-CM

## 2016-09-04 LAB — GC/CHLAMYDIA PROBE AMP
CHLAMYDIA, DNA PROBE: NEGATIVE
Neisseria gonorrhoeae by PCR: POSITIVE — AB

## 2016-09-04 NOTE — Telephone Encounter (Signed)
Patient needs FMLA forms to completed by [place] for her birth control issues that she is having. I have completed what I could from the OV notes and highlighted the areas I was not sure about. It doesn't appear we have taken her out of work for anything. I will place the forms in your box on 07/05/16, please complete and return to the FMLA/Disability box at the 102 checkout desk within 5-7 business days. Thank you!

## 2016-09-07 NOTE — Telephone Encounter (Signed)
Place in box on 09/04/16 not 07/05/16

## 2016-09-11 ENCOUNTER — Encounter: Payer: Self-pay | Admitting: Obstetrics and Gynecology

## 2016-09-11 ENCOUNTER — Ambulatory Visit: Payer: BLUE CROSS/BLUE SHIELD | Admitting: Obstetrics and Gynecology

## 2016-09-11 NOTE — Progress Notes (Signed)
Patient did not keep GYN appointment for 09/11/2016.  Timiko Offutt, Jr MD Attending [facility] (Faculty Practice)   

## 2016-09-12 NOTE — Telephone Encounter (Signed)
Have we completed these forms? Today is the 6th business day. Please let me know thank you!

## 2016-09-12 NOTE — Telephone Encounter (Signed)
This is completed.  Placed in fmla box.  Hope this was done correctly

## 2016-09-12 NOTE — Telephone Encounter (Signed)
Paperwork scanned and faxed to employer on 09/12/16

## 2017-05-29 ENCOUNTER — Encounter: Payer: Self-pay | Admitting: Physician Assistant

## 2019-03-10 ENCOUNTER — Encounter: Payer: Self-pay | Admitting: Obstetrics and Gynecology

## 2020-11-29 ENCOUNTER — Ambulatory Visit: Payer: BLUE CROSS/BLUE SHIELD | Admitting: Family Medicine

## 2021-09-12 ENCOUNTER — Ambulatory Visit
Admission: EM | Admit: 2021-09-12 | Discharge: 2021-09-12 | Disposition: A | Discharge status: Home / Self Care | Payer: BC Managed Care – PPO | Attending: Emergency Medicine | Admitting: Emergency Medicine

## 2021-09-12 DIAGNOSIS — J309 Allergic rhinitis, unspecified: Secondary | ICD-10-CM

## 2021-09-12 DIAGNOSIS — H9203 Otalgia, bilateral: Secondary | ICD-10-CM | POA: Diagnosis not present

## 2021-09-12 DIAGNOSIS — H60391 Other infective otitis externa, right ear: Secondary | ICD-10-CM | POA: Diagnosis not present

## 2021-09-12 DIAGNOSIS — J0140 Acute pansinusitis, unspecified: Secondary | ICD-10-CM

## 2021-09-12 DIAGNOSIS — H66002 Acute suppurative otitis media without spontaneous rupture of ear drum, left ear: Secondary | ICD-10-CM

## 2021-09-12 DIAGNOSIS — H6011 Cellulitis of right external ear: Secondary | ICD-10-CM

## 2021-09-12 MED ORDER — CIPROFLOXACIN-DEXAMETHASONE 0.3-0.1 % OT SUSP: 4.0000 [drp] | Freq: Two times a day (BID) | OTIC | 0 refills | Status: DC

## 2021-09-12 MED ORDER — KETOROLAC TROMETHAMINE 30 MG/ML IJ SOLN
30.0000 mg | Freq: Once | INTRAMUSCULAR | Status: AC
  Administered 2021-09-12: 30 mg via INTRAMUSCULAR

## 2021-09-12 MED ORDER — FEXOFENADINE HCL 180 MG PO TABS: 180.0000 mg | ORAL_TABLET | Freq: Every day | ORAL | 1 refills | Status: DC

## 2021-09-12 MED ORDER — CEFDINIR 300 MG PO CAPS: 300.0000 mg | ORAL_CAPSULE | Freq: Two times a day (BID) | ORAL | 0 refills | Status: AC

## 2021-09-12 MED ORDER — FLUTICASONE PROPIONATE 50 MCG/ACT NA SUSP: 1.0000 | Freq: Every day | NASAL | 1 refills | Status: DC

## 2021-09-12 NOTE — ED Triage Notes (Signed)
Right ear Pain x 2 days. Hurts to open mouth or chew food, Ear had some clear drainage that started 3 days ago. Has taking Ibuprofen but does not help with the pain.

## 2021-09-12 NOTE — ED Notes (Signed)
Right ear Pain x 2 days. Hurts to open mouth or chew food, Ear had some clear drainage that started 3 days ago. Has taking Ibuprofen but does not help with the pain.   Pain 10/10

## 2021-09-12 NOTE — Discharge Instructions (Addendum)
Physical exam today revealed infectious otitis externa in your right ear and infectious otitis media in your left ear.  Based on these findings, I can safely assume that you have had a bacterial infection in your sinuses as well.   Please see the list below for recommended medications, dosages and frequencies to provide relief of your current symptoms:    Omnicef (cefdinir):  1 capsule twice daily for 10 days, you can take it with or without food.  This antibiotic can cause upset stomach, this will resolve once antibiotics are complete.  You are welcome to use a probiotic, eat yogurt, take Imodium while taking this medication.  Please avoid other systemic medications such as Maalox, Pepto-Bismol or milk of magnesia as they can interfere with your body's ability to absorb the antibiotics.   Allegra (fexofenadine): This is an excellent second-generation antihistamine that helps to reduce respiratory inflammatory response to environmental allergens.  This medication is not known to cause daytime sleepiness so it can be taken in the daytime.  If you find that it does make you sleepy, please feel free to take it at bedtime.   Flonase (fluticasone): This is a steroid nasal spray that you use once daily, 1 spray in each nare.  This medication does not work well if you decide to use it only used as you feel you need to, it works best used on a daily basis.  After 3 to 5 days of use, you will notice significant reduction of the inflammation and mucus production that is currently being caused by exposure to allergens, whether seasonal or environmental.  The most common side effect of this medication is nosebleeds.  If you experience a nosebleed, please discontinue use for 1 week, then feel free to resume.  I have provided you with a prescription.     Advil, Motrin (ibuprofen): This is a good anti-inflammatory medication which not only addresses aches, pains but also significantly reduces soft tissue inflammation of  the upper airways that causes sinus and nasal congestion as well as inflammation of the lower airways which makes you feel like your breathing is constricted or your cough feel tight.  I recommend that you take between 400 mg every 8 hours as needed.     If your insurance will not cover your allergy medications, please consider downloading the Good Rx app which is free.  You can find considerable discounts on prescription and over-the-counter medications.  You are also provided with an injection of ketorolac, Eckroth-dose nonsteroid anti-inflammatory pain medication that should significantly reduce the pain in and around your right ear for the next 8 hours.   Please follow-up within the next 5-7 days either with your primary care provider or urgent care if your symptoms do not resolve.  If you do not have a primary care provider, we will assist you in finding one.   Thank you for visiting urgent care today.  We appreciate the opportunity to participate in your care.

## 2021-09-12 NOTE — ED Provider Notes (Signed)
[facility]    CSN: [id number] Arrival date & time: 09/12/21  0934    HISTORY   Chief Complaint  Patient presents with   Otalgia   HPI Erin Johnston is a pleasant, 36 y.o. female who presents to urgent care today. Patient complains of 2-day history of right ear pain.  Patient states at this time, it hurts to open her mouth or chew food.  Patient states she has some drainage from her right ear that began 3 days ago, states it was not bloody or purulent, noticed it on her pillowcase.  Patient states she been taking ibuprofen without meaningful relief of her pain.  Patient states she is also noticed some sinus pressure and decreased hearing in her right ear, states it hurts to touch her right ear or lie on that side.  The history is provided by the patient.   Past Medical History:  Diagnosis Date   Hypertension    after delivery first child   UTI (urinary tract infection)    Vaginal Pap smear, abnormal    Patient Active Problem List   Diagnosis Date Noted   Pregnancy 10/19/2015   Positive GBS test 10/01/2015   Not immune to rubella 09/30/2015   Anemia of mother in pregnancy, antepartum 09/30/2015   Late prenatal care affecting pregnancy, antepartum 09/29/2015   Normal delivery at term 09/29/2015   History of gestational hypertension 09/29/2015   Pregnancy related back pain in third trimester, antepartum 09/29/2015   Past Surgical History:  Procedure Laterality Date   CHOLECYSTECTOMY     INDUCED ABORTION     OB History     Gravida  7   Para  4   Term  4   Preterm      AB  3   Living  4      SAB  1   IAB  2   Ectopic  0   Multiple  0   Live Births  4          Home Medications    Prior to Admission medications   Medication Sig Start Date End Date Taking? Authorizing Provider  cefdinir (OMNICEF) 300 MG capsule Take 1 capsule (300 mg total) by mouth 2 (two) times daily for 10 days. 09/12/21 09/22/21 Yes Theadora Rama Scales, PA-C   ciprofloxacin-dexamethasone (CIPRODEX) OTIC suspension Place 4 drops into the right ear 2 (two) times daily. X 7 days 09/12/21  Yes Theadora Rama Scales, PA-C  fexofenadine (ALLEGRA) 180 MG tablet Take 1 tablet (180 mg total) by mouth daily. 09/12/21 03/11/22 Yes Theadora Rama Scales, PA-C  fluticasone (FLONASE) 50 MCG/ACT nasal spray Place 1 spray into both nostrils daily. 09/12/21  Yes Theadora Rama Scales, PA-C    Family History Family History  Problem Relation Age of Onset   Hypertension Mother    Social History Social History   Tobacco Use   Smoking status: Former    Types: Cigarettes   Smokeless tobacco: Never  Substance Use Topics   Alcohol use: No   Drug use: No   Allergies   Tomato  Review of Systems Review of Systems Pertinent findings revealed after performing a 14 point review of systems has been noted in the history of present illness.  Physical Exam Triage Vital Signs ED Triage Vitals  Enc Vitals Group     BP 12/24/20 0827 (!) 147/82     Pulse Rate 12/24/20 0827 72     Resp 12/24/20 0827 18  Temp 12/24/20 0827 98.3 F (36.8 C)     Temp Source 12/24/20 0827 Oral     SpO2 12/24/20 0827 98 %     Weight --      Height --      Head Circumference --      Peak Flow --      Pain Score 12/24/20 0826 5     Pain Loc --      Pain Edu? --      Excl. in GC? --   No data found.  Updated Vital Signs BP (!) 163/114 (BP Location: Left Arm)   Pulse 87   Temp 98.6 F (37 C) (Oral)   Resp 18   SpO2 97%   Physical Exam Vitals and nursing note reviewed.  Constitutional:      General: She is not in acute distress.    Appearance: Normal appearance. She is not ill-appearing.  HENT:     Head: Normocephalic and atraumatic.     Jaw: There is normal jaw occlusion.     Salivary Glands: Right salivary gland is not diffusely enlarged or tender. Left salivary gland is not diffusely enlarged or tender.     Right Ear: Decreased hearing noted. Tenderness (Diffuse  swelling and tenderness of concha, tragus) present. No drainage. A middle ear effusion (Mild) is present. There is no impacted cerumen. Tympanic membrane is retracted (TM only partially visualized but appears retracted, erythematous and injected).     Left Ear: Hearing, tympanic membrane, ear canal and external ear normal. No decreased hearing noted. No drainage or tenderness.  No middle ear effusion. There is no impacted cerumen.     Nose: Rhinorrhea present. No nasal deformity, septal deviation or congestion. Rhinorrhea is clear.     Right Turbinates: Enlarged, swollen and pale.     Left Turbinates: Enlarged, swollen and pale.     Right Sinus: Maxillary sinus tenderness and frontal sinus tenderness present.     Left Sinus: Maxillary sinus tenderness and frontal sinus tenderness present.     Mouth/Throat:     Lips: Pink. No lesions.     Mouth: Mucous membranes are moist. No oral lesions.     Pharynx: Oropharynx is clear. Uvula midline. No posterior oropharyngeal erythema or uvula swelling.     Tonsils: No tonsillar exudate. 0 on the right. 0 on the left.  Eyes:     General: Lids are normal.        Right eye: No discharge.        Left eye: No discharge.     Extraocular Movements: Extraocular movements intact.     Conjunctiva/sclera: Conjunctivae normal.     Right eye: Right conjunctiva is not injected.     Left eye: Left conjunctiva is not injected.  Neck:     Trachea: Trachea and phonation normal.  Cardiovascular:     Rate and Rhythm: Normal rate and regular rhythm.     Pulses: Normal pulses.     Heart sounds: Normal heart sounds. No murmur heard.    No friction rub. No gallop.  Pulmonary:     Effort: Pulmonary effort is normal. No accessory muscle usage, prolonged expiration or respiratory distress.     Breath sounds: Normal breath sounds. No stridor, decreased air movement or transmitted upper airway sounds. No decreased breath sounds, wheezing, rhonchi or rales.  Chest:     Chest  wall: No tenderness.  Musculoskeletal:        General: Normal range of motion.  Cervical back: Normal range of motion and neck supple. Normal range of motion.  Lymphadenopathy:     Cervical: No cervical adenopathy.  Skin:    General: Skin is warm and dry.     Findings: No erythema or rash.  Neurological:     General: No focal deficit present.     Mental Status: She is alert and oriented to person, place, and time.  Psychiatric:        Mood and Affect: Mood normal.        Behavior: Behavior normal.     Visual Acuity Right Eye Distance:   Left Eye Distance:   Bilateral Distance:    Right Eye Near:   Left Eye Near:    Bilateral Near:     UC Couse / Diagnostics / Procedures:     Radiology No results found.  Procedures Procedures (including critical care time) EKG  Pending results:  Labs Reviewed - No data to display  Medications Ordered in UC: Medications  ketorolac (TORADOL) 30 MG/ML injection 30 mg (30 mg Intramuscular Given 09/12/21 1021)    UC Diagnoses / Final Clinical Impressions(s)   I have reviewed the triage vital signs and the nursing notes.  Pertinent labs & imaging results that were available during my care of the patient were reviewed by me and considered in my medical decision making (see chart for details).    Final diagnoses:  Allergic rhinitis, unspecified seasonality, unspecified trigger  Acute pansinusitis, recurrence not specified  Acute infective otitis externa of right ear  Acute suppurative otitis media of left ear without spontaneous rupture of tympanic membrane, recurrence not specified  Otalgia of both ears  Cellulitis of concha of right ear   Patient likely with initial bacterial sinusitis now with infective otitis interna and externa of right ear.  Patient provided with a prescription for cefdinir and Ciprodex eardrops.  Patient also advised to begin some allergy medications for physical exam findings concerning for allergies.   Return precautions advised.  ED Prescriptions     Medication Sig Dispense Auth. Provider   cefdinir (OMNICEF) 300 MG capsule Take 1 capsule (300 mg total) by mouth 2 (two) times daily for 10 days. 20 capsule Theadora Rama Scales, PA-C   ciprofloxacin-dexamethasone (CIPRODEX) OTIC suspension Place 4 drops into the right ear 2 (two) times daily. X 7 days 7.5 mL Theadora Rama Scales, PA-C   fexofenadine (ALLEGRA) 180 MG tablet Take 1 tablet (180 mg total) by mouth daily. 90 tablet Theadora Rama Scales, PA-C   fluticasone (FLONASE) 50 MCG/ACT nasal spray Place 1 spray into both nostrils daily. 47.4 mL Theadora Rama Scales, PA-C      PDMP not reviewed this encounter.  Disposition Upon Discharge:  Condition: stable for discharge home Home: take medications as prescribed; routine discharge instructions as discussed; follow up as advised.  Patient presented with an acute illness with associated systemic symptoms and significant discomfort requiring urgent management. In my opinion, this is a condition that a prudent lay person (someone who possesses an average knowledge of health and medicine) may potentially expect to result in complications if not addressed urgently such as respiratory distress, impairment of bodily function or dysfunction of bodily organs.   Routine symptom specific, illness specific and/or disease specific instructions were discussed with the patient and/or caregiver at length.   As such, the patient has been evaluated and assessed, work-up was performed and treatment was provided in alignment with urgent care protocols and evidence based medicine.  Patient/parent/caregiver has been  advised that the patient may require follow up for further testing and treatment if the symptoms continue in spite of treatment, as clinically indicated and appropriate.  If the patient was tested for COVID-19, Influenza and/or RSV, then the patient/parent/guardian was advised to isolate at  home pending the results of his/her diagnostic coronavirus test and potentially longer if they're positive. I have also advised pt that if his/her COVID-19 test returns positive, it's recommended to self-isolate for at least 10 days after symptoms first appeared AND until fever-free for 24 hours without fever reducer AND other symptoms have improved or resolved. Discussed self-isolation recommendations as well as instructions for household member/close contacts as per the [facility] and  [facility], and also gave patient the COVID packet with this information.  Patient/parent/caregiver has been advised [facility]CP in 3-5 days if no better; to PCP or the Emergency Department if new signs and symptoms develop, or if the current signs or symptoms continue to change or worsen for further workup, evaluation and treatment as clinically indicated and appropriate  The patient will follow up with their current PCP if and as advised. If the patient does not currently have a PCP we will assist them in obtaining one.   The patient may need specialty follow up if the symptoms continue, in spite of conservative treatment and management, for further workup, evaluation, consultation and treatment as clinically indicated and appropriate.  Patient/parent/caregiver verbalized understanding and agreement of plan as discussed.  All questions were addressed during visit.  Please see discharge instructions below for further details of plan.  Discharge Instructions:   Discharge Instructions      Physical exam today revealed infectious otitis externa in your right ear and infectious otitis media in your left ear.  Based on these findings, I can safely assume that you have had a bacterial infection in your sinuses as well.   Please see the list below for recommended medications, dosages and frequencies to provide relief of your current symptoms:    Omnicef (cefdinir):  1 capsule twice daily for 10 days, you can take  it with or without food.  This antibiotic can cause upset stomach, this will resolve once antibiotics are complete.  You are welcome to use a probiotic, eat yogurt, take Imodium while taking this medication.  Please avoid other systemic medications such as Maalox, Pepto-Bismol or milk of magnesia as they can interfere with your body's ability to absorb the antibiotics.   Allegra (fexofenadine): This is an excellent second-generation antihistamine that helps to reduce respiratory inflammatory response to environmental allergens.  This medication is not known to cause daytime sleepiness so it can be taken in the daytime.  If you find that it does make you sleepy, please feel free to take it at bedtime.   Flonase (fluticasone): This is a steroid nasal spray that you use once daily, 1 spray in each nare.  This medication does not work well if you decide to use it only used as you feel you need to, it works best used on a daily basis.  After 3 to 5 days of use, you will notice significant reduction of the inflammation and mucus production that is currently being caused by exposure to allergens, whether seasonal or environmental.  The most common side effect of this medication is nosebleeds.  If you experience a nosebleed, please discontinue use for 1 week, then feel free to resume.  I have provided you with a prescription.  Advil, Motrin (ibuprofen): This is a good anti-inflammatory medication which not only addresses aches, pains but also significantly reduces soft tissue inflammation of the upper airways that causes sinus and nasal congestion as well as inflammation of the lower airways which makes you feel like your breathing is constricted or your cough feel tight.  I recommend that you take between 400 mg every 8 hours as needed.     If your insurance will not cover your allergy medications, please consider downloading the Good Rx app which is free.  You can find considerable discounts on prescription and  over-the-counter medications.  You are also provided with an injection of ketorolac, Santistevan-dose nonsteroid anti-inflammatory pain medication that should significantly reduce the pain in and around your right ear for the next 8 hours.   Please follow-up within the next 5-7 days either with your primary care provider or urgent care if your symptoms do not resolve.  If you do not have a primary care provider, we will assist you in finding one.   Thank you for visiting urgent care today.  We appreciate the opportunity to participate in your care.     This office note has been dictated using Teaching laboratory technician.  Unfortunately, this method of dictation can sometimes lead to typographical or grammatical errors.  I apologize for your inconvenience in advance if this occurs.  Please do not hesitate to reach out to me if clarification is needed.      Theadora Rama Scales, [place] 09/14/21 [phone]

## 2022-02-04 ENCOUNTER — Ambulatory Visit (HOSPITAL_COMMUNITY)
Admission: EM | Admit: 2022-02-04 | Discharge: 2022-02-04 | Disposition: A | Discharge status: Home / Self Care | Payer: BC Managed Care – PPO | Attending: Internal Medicine | Admitting: Internal Medicine

## 2022-02-04 ENCOUNTER — Encounter (HOSPITAL_COMMUNITY): Payer: Self-pay

## 2022-02-04 DIAGNOSIS — Z113 Encounter for screening for infections with a predominantly sexual mode of transmission: Secondary | ICD-10-CM | POA: Insufficient documentation

## 2022-02-04 DIAGNOSIS — Z202 Contact with and (suspected) exposure to infections with a predominantly sexual mode of transmission: Secondary | ICD-10-CM | POA: Diagnosis not present

## 2022-02-04 NOTE — Discharge Instructions (Signed)
Your STD testing has been sent to the lab and will come back in the next 2 to 3 days.  We will call you if any of your results are positive requiring treatment and treat you at that time.   Avoid sexual intercourse until your STD results come back.  If any of your STD results are positive, you will need to avoid sexual intercourse for 7 days while you are being treated to prevent spread of STD.  Condom use is the best way to prevent spread of STDs.  Return to urgent care as needed.  

## 2022-02-04 NOTE — ED Provider Notes (Signed)
[facility]    CSN: [id number] Arrival date & time: 02/04/22  1649      History   Chief Complaint Chief Complaint  Patient presents with   Exposure to STD    HPI Erin Johnston is a 36 y.o. female.   Patient presents urgent care for STD testing.  She is currently asymptomatic, however her ex-boyfriend with whom she recently had unprotected intercourse told her to come to urgent care to get tested.   Exposure to STD    Past Medical History:  Diagnosis Date   Hypertension    after delivery first child   UTI (urinary tract infection)    Vaginal Pap smear, abnormal     Patient Active Problem List   Diagnosis Date Noted   Pregnancy 10/19/2015   Positive GBS test 10/01/2015   Not immune to rubella 09/30/2015   Anemia of mother in pregnancy, antepartum 09/30/2015   Late prenatal care affecting pregnancy, antepartum 09/29/2015   Normal delivery at term 09/29/2015   History of gestational hypertension 09/29/2015   Pregnancy related back pain in third trimester, antepartum 09/29/2015    Past Surgical History:  Procedure Laterality Date   CHOLECYSTECTOMY     INDUCED ABORTION      OB History     Gravida  7   Para  4   Term  4   Preterm      AB  3   Living  4      SAB  1   IAB  2   Ectopic  0   Multiple  0   Live Births  4            Home Medications    Prior to Admission medications   Medication Sig Start Date End Date Taking? Authorizing Provider  ciprofloxacin-dexamethasone (CIPRODEX) OTIC suspension Place 4 drops into the right ear 2 (two) times daily. X 7 days 09/12/21   Theadora Rama Scales, PA-C  fexofenadine (ALLEGRA) 180 MG tablet Take 1 tablet (180 mg total) by mouth daily. 09/12/21 03/11/22  Theadora Rama Scales, PA-C  fluticasone (FLONASE) 50 MCG/ACT nasal spray Place 1 spray into both nostrils daily. 09/12/21   Theadora Rama Scales, PA-C    Family History Family History  Problem Relation Age of Onset    Hypertension Mother     Social History Social History   Tobacco Use   Smoking status: Former    Types: Cigarettes   Smokeless tobacco: Never  Substance Use Topics   Alcohol use: No   Drug use: No     Allergies   Tomato   Review of Systems Review of Systems   Physical Exam Triage Vital Signs ED Triage Vitals  Enc Vitals Group     BP 02/04/22 1734 (!) 155/105     Pulse Rate 02/04/22 1734 74     Resp 02/04/22 1734 16     Temp 02/04/22 1734 98.6 F (37 C)     Temp Source 02/04/22 1734 Oral     SpO2 02/04/22 1734 98 %     Weight --      Height --      Head Circumference --      Peak Flow --      Pain Score 02/04/22 1732 0     Pain Loc --      Pain Edu? --      Excl. in GC? --    No data found.  Updated Vital Signs BP (!) 155/105 (  BP Location: Left Arm)   Pulse 74   Temp 98.6 F (37 C) (Oral)   Resp 16   LMP 01/12/2022 (Exact Date)   SpO2 98%   Visual Acuity Right Eye Distance:   Left Eye Distance:   Bilateral Distance:    Right Eye Near:   Left Eye Near:    Bilateral Near:     Physical Exam   UC Treatments / Results  Labs (all labs ordered are listed, but only abnormal results are displayed) Labs Reviewed  CERVICOVAGINAL ANCILLARY ONLY    EKG   Radiology No results found.  Procedures Procedures (including critical care time)  Medications Ordered in UC Medications - No data to display  Initial Impression / Assessment and Plan / UC Course  I have reviewed the triage vital signs and the nursing notes.  Pertinent labs & imaging results that were available during my care of the patient were reviewed by me and considered in my medical decision making (see chart for details).     *** Final Clinical Impressions(s) / UC Diagnoses   Final diagnoses:  Possible exposure to STD  Screen for STD (sexually transmitted disease)     Discharge Instructions      Your STD testing has been sent to the lab and will come back in the next 2  to 3 days.  We will call you if any of your results are positive requiring treatment and treat you at that time.   Avoid sexual intercourse until your STD results come back.  If any of your STD results are positive, you will need to avoid sexual intercourse for 7 days while you are being treated to prevent spread of STD.  Condom use is the best way to prevent spread of STDs.  Return to urgent care as needed.    ED Prescriptions   None    PDMP not reviewed this encounter.

## 2022-02-04 NOTE — ED Triage Notes (Addendum)
Chief Complaint: States her ex-boyfriend was seen today for STDs. No current symptoms.   Onset: last had sex with ex this week.    Patient wanting to swab for chlamydia, Gonorrhea, and Trich. Patient declines blood work for syphilis and HIV.

## 2022-02-06 LAB — CERVICOVAGINAL ANCILLARY ONLY
Bacterial Vaginitis (gardnerella): POSITIVE — AB
Candida Glabrata: NEGATIVE
Candida Vaginitis: NEGATIVE
Chlamydia: NEGATIVE
Comment: NEGATIVE
Comment: NEGATIVE
Comment: NEGATIVE
Comment: NEGATIVE
Comment: NEGATIVE
Comment: NORMAL
Neisseria Gonorrhea: NEGATIVE
Trichomonas: NEGATIVE

## 2022-06-28 ENCOUNTER — Encounter: Payer: Self-pay | Admitting: Obstetrics and Gynecology

## 2022-06-28 ENCOUNTER — Ambulatory Visit (INDEPENDENT_AMBULATORY_CARE_PROVIDER_SITE_OTHER): Payer: BC Managed Care – PPO | Admitting: Obstetrics and Gynecology

## 2022-06-28 ENCOUNTER — Other Ambulatory Visit (HOSPITAL_COMMUNITY)
Admission: RE | Admit: 2022-06-28 | Discharge: 2022-06-28 | Disposition: A | Discharge status: Home / Self Care | Payer: BC Managed Care – PPO | Source: Ambulatory Visit | Attending: Obstetrics and Gynecology | Admitting: Obstetrics and Gynecology

## 2022-06-28 VITALS — BP 156/104 | HR 80 | Ht 60.0 in | Wt 180.0 lb

## 2022-06-28 DIAGNOSIS — Z01419 Encounter for gynecological examination (general) (routine) without abnormal findings: Secondary | ICD-10-CM | POA: Diagnosis not present

## 2022-06-28 DIAGNOSIS — Z202 Contact with and (suspected) exposure to infections with a predominantly sexual mode of transmission: Secondary | ICD-10-CM | POA: Insufficient documentation

## 2022-06-28 DIAGNOSIS — Z975 Presence of (intrauterine) contraceptive device: Secondary | ICD-10-CM | POA: Insufficient documentation

## 2022-06-28 DIAGNOSIS — I159 Secondary hypertension, unspecified: Secondary | ICD-10-CM

## 2022-06-28 DIAGNOSIS — Z3046 Encounter for surveillance of implantable subdermal contraceptive: Secondary | ICD-10-CM | POA: Insufficient documentation

## 2022-06-28 DIAGNOSIS — I10 Essential (primary) hypertension: Secondary | ICD-10-CM | POA: Insufficient documentation

## 2022-06-28 MED ORDER — NIFEDIPINE ER OSMOTIC RELEASE 30 MG PO TB24: 30.0000 mg | ORAL_TABLET | Freq: Every day | ORAL | 2 refills | Status: DC

## 2022-06-28 NOTE — Progress Notes (Signed)
37 y.o. New GYN presents for AEX/PAP/STD screening and Nexplanon removal.    BP are elevated today, Pt is requesting PCP referral.

## 2022-06-28 NOTE — Progress Notes (Signed)
Erin Johnston is a 37 y.o. [phone] female here for a routine annual gynecologic exam.  Current complaints: Desires to have her Nexplanon removed.   Denies abnormal vaginal bleeding, discharge, pelvic pain, problems with intercourse or other gynecologic concerns.    Gynecologic History Patient's last menstrual period was 06/25/2022 (exact date). Contraception: Nexplanon Last Pap: uncertain. Results were: normal Last mammogram: NA.   Obstetric History OB History  Gravida Para Term Preterm AB Living  7 4 4   3 4   SAB IAB Ectopic Multiple Live Births  1 2 0 0 4    # Outcome Date GA Lbr Len/2nd Weight Sex Delivery Anes PTL Lv  7 Term 10/19/15 [redacted]w[redacted]d 07:17 / 00:07 6 lb 2.8 oz (2.8 kg) F Vag-Spont EPI  LIV     Birth Comments: none  6 SAB 2016          5 Term 12/30/13 [redacted]w[redacted]d 15:03 / 00:05 7 lb 11.1 oz (3.49 kg) F Vag-Spont EPI  LIV  4 IAB 2012          3 IAB 2012          2 Term 05/11/07 [redacted]w[redacted]d  6 lb 11 oz (3.033 kg) F Vag-Spont EPI  LIV     Birth Comments: no complications, born at [facility] in [place], [place]  1 Term 05/17/06 [redacted]w[redacted]d  6 lb 1 oz (2.75 kg)  Vag-Spont EPI  LIV     Birth Comments: blood pressure elevated after delivery, born at [facility] in [place], [place]    Past Medical History:  Diagnosis Date   Hypertension    after delivery first child   UTI (urinary tract infection)    Vaginal Pap smear, abnormal     Past Surgical History:  Procedure Laterality Date   CHOLECYSTECTOMY     INDUCED ABORTION      Current Outpatient Medications on File Prior to Visit  Medication Sig Dispense Refill   fexofenadine (ALLEGRA) 180 MG tablet Take 1 tablet (180 mg total) by mouth daily. 90 tablet 1   fluticasone (FLONASE) 50 MCG/ACT nasal spray Place 1 spray into both nostrils daily. 47.4 mL 1   No current facility-administered medications on file prior to visit.    Allergies  Allergen Reactions   Bee Pollen Itching   Tomato Itching    Social History   Socioeconomic  History   Marital status: Single    Spouse name: Not on file   Number of children: 4   Years of education: Not on file   Highest education level: Not on file  Occupational History   Not on file  Tobacco Use   Smoking status: Former    Types: Cigarettes   Smokeless tobacco: Never  Vaping Use   Vaping Use: Never used  Substance and Sexual Activity   Alcohol use: No   Drug use: No   Sexual activity: Yes    Birth control/protection: None  Other Topics Concern   Not on file  Social History Narrative   Not on file   Social Determinants of Health   Financial Resource Strain: Not on file  Food Insecurity: Not on file  Transportation Needs: Not on file  Physical Activity: Not on file  Stress: Not on file  Social Connections: Not on file  Intimate Partner Violence: Not on file    Family History  Problem Relation Age of Onset   Hypertension Mother    Cancer Maternal Grandfather    Cancer Paternal Grandmother  The following portions of the patient's history were reviewed and updated as appropriate: allergies, current medications, past family history, past medical history, past social history, past surgical history and problem list.  Review of Systems Pertinent items noted in HPI and remainder of comprehensive ROS otherwise negative.   Objective:  BP (!) 156/104 (BP Location: Right Arm, Cuff Size: Large)   Pulse 80   Ht 5' (1.524 m)   Wt 180 lb (81.6 kg)   LMP 06/25/2022 (Exact Date)   BMI 35.15 kg/m  Chaperone present CONSTITUTIONAL: Well-developed, well-nourished female in no acute distress.  HENT:  Normocephalic, atraumatic, External right and left ear normal. Oropharynx is clear and moist EYES: Conjunctivae and EOM are normal. Pupils are equal, round, and reactive to light. No scleral icterus.  NECK: Normal range of motion, supple, no masses.  Normal thyroid.  SKIN: Skin is warm and dry. No rash noted. Not diaphoretic. No erythema. No pallor. NEUROLGIC: Alert  and oriented to person, place, and time. Normal reflexes, muscle tone coordination. No cranial nerve deficit noted. PSYCHIATRIC: Normal mood and affect. Normal behavior. Normal judgment and thought content. CARDIOVASCULAR: Normal heart rate noted, regular rhythm RESPIRATORY: Clear to auscultation bilaterally. Effort and breath sounds normal, no problems with respiration noted. BREASTS: Symmetric in size. No masses, skin changes, nipple drainage, or lymphadenopathy. ABDOMEN: Soft, normal bowel sounds, no distention noted.  No tenderness, rebound or guarding.  PELVIC: Normal appearing external genitalia; normal appearing vaginal mucosa and cervix.  No abnormal discharge noted.  Pap smear obtained.  Normal uterine size, no other palpable masses, no uterine or adnexal tenderness. MUSCULOSKELETAL: Normal range of motion. No tenderness.  No cyanosis, clubbing, or edema.  2+ distal pulses.       GYNECOLOGY CLINIC PROCEDURE NOTE  Nexplanon Removal Patient identified, informed consent performed, consent signed.   Appropriate time out taken. Nexplanon site identified.  Area prepped in usual sterile fashon. One ml of 1% lidocaine was used to anesthetize the area at the distal end of the implant. A small stab incision was made right beside the implant on the distal portion.  The Nexplanon rod was grasped using hemostats and removed without difficulty.  There was minimal blood loss. There were no complications.  3 ml of 1% lidocaine was injected around the incision for post-procedure analgesia.  Steri-strips were applied over the small incision.  A pressure bandage was applied to reduce any bruising.  The patient tolerated the procedure well and was given post procedure instructions.  Patient is planning to use condoms for contraception/attempt conception.      Assessment:  Annual gynecologic examination with pap smear STD exposure Nexplanon removal Hypertension Plan:  Will follow up results of pap  smear and manage accordingly. STD testing as per pt request Nexplanon removed Will check additional labs due to HTN. Start Procardia. Referral to [facility] for further management of her HTN Routine preventative health maintenance measures emphasized. Please refer to After Visit Summary for other counseling recommendations.    Hermina Staggers, MD, FACOG Attending Obstetrician & Gynecologist Center for [facility], [facility]

## 2022-06-29 LAB — COMPREHENSIVE METABOLIC PANEL
ALT: 12 IU/L (ref 0–32)
AST: 18 IU/L (ref 0–40)
Albumin/Globulin Ratio: 1.4 (ref 1.2–2.2)
Albumin: 4.2 g/dL (ref 3.9–4.9)
Alkaline Phosphatase: 85 IU/L (ref 44–121)
BUN/Creatinine Ratio: 10 (ref 9–23)
BUN: 7 mg/dL (ref 6–20)
Bilirubin Total: 0.4 mg/dL (ref 0.0–1.2)
CO2: 21 mmol/L (ref 20–29)
Calcium: 9 mg/dL (ref 8.7–10.2)
Chloride: 101 mmol/L (ref 96–106)
Creatinine, Ser: 0.69 mg/dL (ref 0.57–1.00)
Globulin, Total: 3.1 g/dL (ref 1.5–4.5)
Glucose: 89 mg/dL (ref 70–99)
Potassium: 4.2 mmol/L (ref 3.5–5.2)
Sodium: 139 mmol/L (ref 134–144)
Total Protein: 7.3 g/dL (ref 6.0–8.5)
eGFR: 116 mL/min/{1.73_m2} (ref >59)

## 2022-06-29 LAB — RPR: RPR Ser Ql: NONREACTIVE

## 2022-06-29 LAB — HEPATITIS B SURFACE ANTIGEN: Hepatitis B Surface Ag: NEGATIVE

## 2022-06-29 LAB — CERVICOVAGINAL ANCILLARY ONLY
Bacterial Vaginitis (gardnerella): NEGATIVE
Candida Glabrata: NEGATIVE
Candida Vaginitis: NEGATIVE
Chlamydia: NEGATIVE
Comment: NEGATIVE
Comment: NEGATIVE
Comment: NEGATIVE
Comment: NEGATIVE
Comment: NEGATIVE
Comment: NORMAL
Neisseria Gonorrhea: NEGATIVE
Trichomonas: NEGATIVE

## 2022-06-29 LAB — CBC
Hematocrit: 38 % (ref 34.0–46.6)
Hemoglobin: 12.1 g/dL (ref 11.1–15.9)
MCH: 27.1 pg (ref 26.6–33.0)
MCHC: 31.8 g/dL (ref 31.5–35.7)
MCV: 85 fL (ref 79–97)
Platelets: 242 10*3/uL (ref 150–450)
RBC: 4.46 x10E6/uL (ref 3.77–5.28)
RDW: 14.3 % (ref 11.7–15.4)
WBC: 5.1 10*3/uL (ref 3.4–10.8)

## 2022-06-29 LAB — TSH: TSH: 2.93 u[IU]/mL (ref 0.450–4.500)

## 2022-06-29 LAB — HEPATITIS C ANTIBODY: Hep C Virus Ab: NONREACTIVE

## 2022-06-29 LAB — HIV ANTIBODY (ROUTINE TESTING W REFLEX): HIV Screen 4th Generation wRfx: NONREACTIVE

## 2022-07-03 LAB — CYTOLOGY - PAP
Comment: NEGATIVE
Diagnosis: NEGATIVE
High risk HPV: NEGATIVE

## 2022-09-23 ENCOUNTER — Inpatient Hospital Stay (HOSPITAL_COMMUNITY): Payer: BC Managed Care – PPO

## 2022-09-23 ENCOUNTER — Encounter (HOSPITAL_COMMUNITY): Payer: Self-pay | Admitting: *Deleted

## 2022-09-23 ENCOUNTER — Inpatient Hospital Stay (HOSPITAL_COMMUNITY)
Admission: AD | Admit: 2022-09-23 | Discharge: 2022-09-23 | Disposition: A | Discharge status: Home / Self Care | Payer: BC Managed Care – PPO | Attending: Obstetrics & Gynecology | Admitting: Obstetrics & Gynecology

## 2022-09-23 DIAGNOSIS — O3411 Maternal care for benign tumor of corpus uteri, first trimester: Secondary | ICD-10-CM | POA: Insufficient documentation

## 2022-09-23 DIAGNOSIS — O468X1 Other antepartum hemorrhage, first trimester: Secondary | ICD-10-CM | POA: Diagnosis not present

## 2022-09-23 DIAGNOSIS — R109 Unspecified abdominal pain: Secondary | ICD-10-CM | POA: Insufficient documentation

## 2022-09-23 DIAGNOSIS — Z3A01 Less than 8 weeks gestation of pregnancy: Secondary | ICD-10-CM

## 2022-09-23 DIAGNOSIS — O09511 Supervision of elderly primigravida, first trimester: Secondary | ICD-10-CM | POA: Diagnosis not present

## 2022-09-23 DIAGNOSIS — O10919 Unspecified pre-existing hypertension complicating pregnancy, unspecified trimester: Secondary | ICD-10-CM

## 2022-09-23 DIAGNOSIS — O200 Threatened abortion: Secondary | ICD-10-CM | POA: Diagnosis not present

## 2022-09-23 DIAGNOSIS — O26899 Other specified pregnancy related conditions, unspecified trimester: Secondary | ICD-10-CM

## 2022-09-23 DIAGNOSIS — O418X10 Other specified disorders of amniotic fluid and membranes, first trimester, not applicable or unspecified: Secondary | ICD-10-CM

## 2022-09-23 DIAGNOSIS — O26891 Other specified pregnancy related conditions, first trimester: Secondary | ICD-10-CM | POA: Diagnosis not present

## 2022-09-23 DIAGNOSIS — O10011 Pre-existing essential hypertension complicating pregnancy, first trimester: Secondary | ICD-10-CM | POA: Diagnosis not present

## 2022-09-23 DIAGNOSIS — O208 Other hemorrhage in early pregnancy: Secondary | ICD-10-CM | POA: Diagnosis not present

## 2022-09-23 DIAGNOSIS — Z3490 Encounter for supervision of normal pregnancy, unspecified, unspecified trimester: Secondary | ICD-10-CM

## 2022-09-23 DIAGNOSIS — D259 Leiomyoma of uterus, unspecified: Secondary | ICD-10-CM | POA: Diagnosis not present

## 2022-09-23 DIAGNOSIS — O26851 Spotting complicating pregnancy, first trimester: Secondary | ICD-10-CM | POA: Diagnosis not present

## 2022-09-23 DIAGNOSIS — I159 Secondary hypertension, unspecified: Secondary | ICD-10-CM

## 2022-09-23 LAB — CBC
HCT: 38.6 % (ref 36.0–46.0)
Hemoglobin: 12.6 g/dL (ref 12.0–15.0)
MCH: 27.3 pg (ref 26.0–34.0)
MCHC: 32.6 g/dL (ref 30.0–36.0)
MCV: 83.7 fL (ref 80.0–100.0)
Platelets: 277 10*3/uL (ref 150–400)
RBC: 4.61 MIL/uL (ref 3.87–5.11)
RDW: 14.8 % (ref 11.5–15.5)
WBC: 6.8 10*3/uL (ref 4.0–10.5)
nRBC: 0 % (ref 0.0–0.2)

## 2022-09-23 LAB — WET PREP, GENITAL
Clue Cells Wet Prep HPF POC: NONE SEEN
Sperm: NONE SEEN
Trich, Wet Prep: NONE SEEN
WBC, Wet Prep HPF POC: <10
Yeast Wet Prep HPF POC: NONE SEEN

## 2022-09-23 LAB — URINALYSIS, ROUTINE W REFLEX MICROSCOPIC
Bilirubin Urine: NEGATIVE
Glucose, UA: NEGATIVE mg/dL
Hgb urine dipstick: NEGATIVE
Ketones, ur: NEGATIVE mg/dL
Leukocytes,Ua: NEGATIVE
Nitrite: NEGATIVE
Protein, ur: NEGATIVE mg/dL
Specific Gravity, Urine: 1.021 (ref 1.005–1.030)
pH: 6 (ref 5.0–8.0)

## 2022-09-23 LAB — POCT PREGNANCY, URINE: Preg Test, Ur: POSITIVE — AB

## 2022-09-23 LAB — HCG, QUANTITATIVE, PREGNANCY: hCG, Beta Chain, Quant, S: 11855 m[IU]/mL — ABNORMAL HIGH (ref <5)

## 2022-09-23 NOTE — Progress Notes (Signed)
Not in l,obby x1

## 2022-09-23 NOTE — MAU Note (Signed)
.  Erin Johnston is a 37 y.o. at [redacted]w[redacted]d here in MAU reporting: abd cramping on and off for about a week and light pink spotting LMP: 08/18/22 Onset of complaint: 1 week Pain score: 7-8 Vitals:   09/23/22 1320  BP: (!) 151/98  Pulse: 90  Resp: 18  Temp: 98.2 F (36.8 C)     FHT: Lab orders placed from triage:  UPT u/A

## 2022-09-23 NOTE — Discharge Instructions (Signed)
Return to MAU: If you have heavier bleeding that soaks through more that 2 pads per hour for an hour or more If you bleed so much that you feel like you might pass out or you do pass out If you have significant abdominal pain that is not improved with Tylenol 1000 mg every 8 hours as needed for pain If you develop a fever > 100.5  

## 2022-09-23 NOTE — MAU Provider Note (Signed)
History     CSN: [id number]  Arrival date and time: 09/23/22 1209   Event Date/Time   First Provider Initiated Contact with Patient 09/23/22 1542      Chief Complaint  Patient presents with   Abdominal Pain   Vaginal Bleeding   HPI [name] is a 37 y.o. year old [id number] female at [redacted]w[redacted]d weeks gestation who presents to MAU reporting intermittent lower abdominal cramping; 7-8/10. She also reports pink spotting. Her LMP 6/17-21/2024. She has cHTN she was Rx'd Procardia XL 30 mg back in May, but has not picked up the prescription.   OB History     Gravida  8   Para  4   Term  4   Preterm      AB  3   Living  4      SAB  1   IAB  2   Ectopic  0   Multiple  0   Live Births  4           Past Medical History:  Diagnosis Date   History of gestational hypertension 09/29/2015   Postpartum gestational hypertension with first pregnancy. Not with any other pregnancies.      Hypertension    after delivery first child   UTI (urinary tract infection)    Vaginal Pap smear, abnormal     Past Surgical History:  Procedure Laterality Date   CHOLECYSTECTOMY     INDUCED ABORTION      Family History  Problem Relation Age of Onset   Hypertension Mother    Cancer Maternal Grandfather    Cancer Paternal Grandmother     Social History   Tobacco Use   Smoking status: Former    Types: Cigarettes   Smokeless tobacco: Never  Vaping Use   Vaping status: Never Used  Substance Use Topics   Alcohol use: No   Drug use: No    Allergies:  Allergies  Allergen Reactions   Bee Pollen Itching   Tomato Itching    Medications Prior to Admission  Medication Sig Dispense Refill Last Dose   fexofenadine (ALLEGRA) 180 MG tablet Take 1 tablet (180 mg total) by mouth daily. 90 tablet 1    fluticasone (FLONASE) 50 MCG/ACT nasal spray Place 1 spray into both nostrils daily. 47.4 mL 1    NIFEdipine (PROCARDIA-XL/NIFEDICAL-XL) 30 MG 24 hr tablet Take 1 tablet (30 mg  total) by mouth daily. Can increase to twice a day as needed for symptomatic contractions 30 tablet 2     Review of Systems  Constitutional: Negative.   HENT: Negative.    Eyes: Negative.   Respiratory: Negative.    Cardiovascular: Negative.   Gastrointestinal: Negative.   Endocrine: Negative.   Genitourinary:  Positive for pelvic pain (cramping) and vaginal bleeding (pink spotting).  Musculoskeletal: Negative.   Skin: Negative.   Allergic/Immunologic: Negative.   Neurological: Negative.   Hematological: Negative.   Psychiatric/Behavioral: Negative.     Physical Exam   Blood pressure (!) 151/98, pulse 90, temperature 98.2 F (36.8 C), resp. rate 18, height 5' (1.524 m), weight 84.4 kg, last menstrual period 08/14/2022.  Physical Exam Vitals and nursing note reviewed.  Constitutional:      Appearance: Normal appearance. She is obese.  Cardiovascular:     Rate and Rhythm: Normal rate.  Pulmonary:     Effort: Pulmonary effort is normal.  Musculoskeletal:        General: Normal range of motion.  Skin:  General: Skin is warm and dry.  Neurological:     Mental Status: She is alert and oriented to person, place, and time.  Psychiatric:        Mood and Affect: Mood normal.        Behavior: Behavior normal.        Thought Content: Thought content normal.        Judgment: Judgment normal.    MAU Course  Procedures  MDM CCUA UPT Wet Prep GC/CT -- Results pending  HCG CBC OB <14 wks U/S  Results for orders placed or performed during the hospital encounter of 09/23/22 (from the past 48 hour(s))  Pregnancy, urine POC     Status: Abnormal   Collection Time: 09/23/22  1:14 PM  Result Value Ref Range   Preg Test, Ur POSITIVE (A) NEGATIVE    Comment:        THE SENSITIVITY OF THIS METHODOLOGY IS >24 mIU/mL   Wet prep, genital     Status: None   Collection Time: 09/23/22  1:26 PM   Specimen: PATH Cytology Cervicovaginal Ancillary Only  Result Value Ref Range    Yeast Wet Prep HPF POC NONE SEEN NONE SEEN   Trich, Wet Prep NONE SEEN NONE SEEN   Clue Cells Wet Prep HPF POC NONE SEEN NONE SEEN   WBC, Wet Prep HPF POC <10 <10   Sperm NONE SEEN     Comment: Performed at [facility], [address], [place], [place] [address]  Urinalysis, Routine w reflex microscopic -Urine, Clean Catch     Status: None   Collection Time: 09/23/22  1:27 PM  Result Value Ref Range   Color, Urine YELLOW YELLOW   APPearance CLEAR CLEAR   Specific Gravity, Urine 1.021 1.005 - 1.030   pH 6.0 5.0 - 8.0   Glucose, UA NEGATIVE NEGATIVE mg/dL   Hgb urine dipstick NEGATIVE NEGATIVE   Bilirubin Urine NEGATIVE NEGATIVE   Ketones, ur NEGATIVE NEGATIVE mg/dL   Protein, ur NEGATIVE NEGATIVE mg/dL   Nitrite NEGATIVE NEGATIVE   Leukocytes,Ua NEGATIVE NEGATIVE    Comment: Performed at [facility], [address], [place], [place] [address]  CBC     Status: None   Collection Time: 09/23/22  1:38 PM  Result Value Ref Range   WBC 6.8 4.0 - 10.5 K/uL   RBC 4.61 3.87 - 5.11 MIL/uL   Hemoglobin 12.6 12.0 - 15.0 g/dL   HCT 25.9 56.3 - 87.5 %   MCV 83.7 80.0 - 100.0 fL   MCH 27.3 26.0 - 34.0 pg   MCHC 32.6 30.0 - 36.0 g/dL   RDW 64.3 32.9 - 51.8 %   Platelets 277 150 - 400 K/uL   nRBC 0.0 0.0 - 0.2 %    Comment: Performed at [facility], [address], [place], [place] [address]  hCG, quantitative, pregnancy     Status: Abnormal   Collection Time: 09/23/22  1:38 PM  Result Value Ref Range   hCG, Beta Chain, Quant, S 11,855 (H) <5 mIU/mL    Comment:          GEST. AGE      CONC.  (mIU/mL)   <=1 WEEK        5 - 50     2 WEEKS       50 - 500     3 WEEKS       100 - 10,000     4 WEEKS  1,000 - 30,000     5 WEEKS     3,500 - 115,000   6-8 WEEKS     12,000 - 270,000    12 WEEKS     15,000 - 220,000        FEMALE AND NON-PREGNANT FEMALE:     LESS THAN 5 mIU/mL Performed at [facility], [address] [place], [place] [address]    US OB LESS THAN  14 WEEKS WITH [place] TRANSVAGINAL  Result Date: 09/23/2022 CLINICAL DATA:  Pain.  First trimester pregnancy. EXAM: OBSTETRIC <14 WK [place] AND TRANSVAGINAL OB US TECHNIQUE: Both transabdominal and transvaginal ultrasound examinations were performed for complete evaluation of the gestation as well as the maternal uterus, adnexal regions, and pelvic cul-de-sac. Transvaginal technique was performed to assess early pregnancy. COMPARISON:  None Available. FINDINGS: Intrauterine gestational sac: Single Yolk sac:  Visualized. Embryo:  Visualized. Cardiac Activity: Visualized. Heart Rate: 133 bpm CRL:  3.0 mm   5 w   6 d                  [place] EDC: 05/20/2023 Subchorionic hemorrhage:  Small Maternal uterus/adnexae: Heterogeneous masslike areas consistent with fibroid along the uterus measuring 4.4 x 3.1 x 3.9 cm. This has fundal anterior. Maternal right ovary has small follicles and measures 2.1 x 1.3 x 1.7 cm. Left ovary is similar in appearance but measures 1.6 x 3.4 x 1.6 cm. IMPRESSION: Single live intrauterine pregnancy of 5 weeks and 6 days with positive fetal heart motion. 4.4 cm fundal uterine fibroid. Small subchorionic hemorrhage. Electronically Signed   By: Karen Kays M.D.   On: 09/23/2022 14:51    Assessment and Plan  1. Spotting and cramping affecting pregnancy, antepartum - Information provided on VB in pregnancy - Return to MAU: If you have heavier bleeding that soaks through more that 2 pads per hour for an hour or more If you bleed so much that you feel like you might pass out or you do pass out If you have significant abdominal pain that is not improved with Tylenol 1000 mg every 8 hours as needed for pain If you develop a fever > 100.5    2. Intrauterine pregnancy - Start PNC  3. Chronic hypertension affecting pregnancy - Advised to pick up Rx for Procardia and take it as directed  4. Leiomyoma of uterus affecting pregnancy in first trimester - Information provided on uterine fibroids   5.  Subchorionic hematoma in first trimester, single or unspecified fetus - Information provided on [facility]   6. Threatened miscarriage in early pregnancy - Information provided on threatened miscarriage   7. [redacted] weeks gestation of pregnancy   - Discharge home - Patient verbalized an understanding of the plan of care and agrees.    Raelyn Mora, CNM 09/23/2022, 3:42 PM

## 2022-09-25 LAB — GC/CHLAMYDIA PROBE AMP (~~LOC~~) NOT AT ARMC
Chlamydia: NEGATIVE
Comment: NEGATIVE
Comment: NORMAL
Neisseria Gonorrhea: NEGATIVE

## 2022-10-24 ENCOUNTER — Ambulatory Visit: Payer: BC Managed Care – PPO

## 2022-10-24 DIAGNOSIS — O0990 Supervision of high risk pregnancy, unspecified, unspecified trimester: Secondary | ICD-10-CM

## 2022-10-24 NOTE — Progress Notes (Signed)
New OB Intake  I connected with Erin Johnston  on 10/24/22 at  3:10 PM EDT by telephone Video Visit and verified that I am speaking with the correct person using two identifiers. Nurse is located at [facility] and pt is located at Home.  I discussed the limitations, risks, security and privacy concerns of performing an evaluation and management service by telephone and the availability of in person appointments. I also discussed with the patient that there may be a patient responsible charge related to this service. The patient expressed understanding and agreed to proceed.  I explained I am completing New OB Intake today. We discussed EDD of 05/21/2023, by Last Menstrual Period. Pt is [id number]. I reviewed her allergies, medications and Medical/Surgical/OB history.    Patient Active Problem List   Diagnosis Date Noted   Hypertension 06/28/2022    Concerns addressed today  Delivery Plans Plans to deliver at [facility]. Discussed the nature of our practice with multiple providers including residents and students. Due to the size of the practice, the delivering provider may not be the same as those providing prenatal care.   Patient is not interested in water birth. Offered upcoming OB visit with CNM to discuss further.  MyChart/Babyscripts MyChart access verified. I explained pt will have some visits in office and some virtually. Babyscripts instructions given and order placed. Patient verifies receipt of registration text/e-mail. Account successfully created and app downloaded.  Blood Pressure Cuff/Weight Scale Patient has private insurance; instructed to purchase blood pressure cuff and bring to first prenatal appt. Explained after first prenatal appt pt will check weekly and document in Babyscripts. Patient does have weight scale.  Anatomy US Explained first scheduled [place] will be around 19 weeks. Anatomy US scheduled TBD.  Is patient a CenteringPregnancy candidate?  Not a  Candidate Declined due to Group setting Not a candidate due to [facility], medication controlled If accepted,    Is patient a Mom+Baby Combined Care candidate?  Declined   If accepted, confirm patient does not intend to move from the area for at least 12 months, then notify Mom+Baby staff  Interested in [place]? If yes, send referral and doula dot phrase.   Is patient a candidate for Babyscripts Optimization? No - CHTN  First visit review I reviewed new OB appt with patient. Explained pt will be seen by Corlis Hove at first visit. Discussed Avelina Laine genetic screening with patient. Is Panor[employer] and Horizon.. Routine prenatal labs  to be collected at [place] provider visit    Last Pap Diagnosis  Date Value Ref Range Status  06/28/2022   Final   - Negative for intraepithelial lesion or malignancy (NILM)    Hamilton Capri, RN 10/24/2022  4:18 PM

## 2022-11-08 NOTE — Progress Notes (Signed)
History:   Erin Johnston is a 37 y.o. [id number] at [redacted]w[redacted]d by LMP being seen today for her first obstetrical visit.  Her obstetrical history is significant for advanced maternal age, obesity, pregnancy induced hypertension, and chronic HTN and h/o gestational diabetes . Patient does not intend to breast feed. Pregnancy history fully reviewed.  Patient reports no complaints.      HISTORY: OB History  Gravida Para Term Preterm AB Living  8 4 4  0 3 4  SAB IAB Ectopic Multiple Live Births  1 2 0 0 4    # Outcome Date GA Lbr Len/2nd Weight Sex Type Anes PTL Lv  8 Current           7 Term 10/19/15 [redacted]w[redacted]d 07:17 / 00:07 6 lb 2.8 oz (2.8 kg) F Vag-Spont EPI  LIV     Birth Comments: none     Name: Erin Johnston     Apgar1: 9  Apgar5: 9  6 SAB 2016          5 Term 12/30/13 [redacted]w[redacted]d 15:03 / 00:05 7 lb 11.1 oz (3.49 kg) F Vag-Spont EPI  LIV     Apgar1: 8  Apgar5: 9  4 IAB 2012          3 IAB 2012          2 Term 05/11/07 [redacted]w[redacted]d  6 lb 11 oz (3.033 kg) F Vag-Spont EPI  LIV     Birth Comments: no complications, born at [facility] in [place], [place]  1 Term 05/17/06 [redacted]w[redacted]d  6 lb 1 oz (2.75 kg)  Vag-Spont EPI  LIV     Birth Comments: blood pressure elevated after delivery, born at [facility] in [place], [place]    Last pap smear was done 06/2022 and was normal  Past Medical History:  Diagnosis Date   History of gestational hypertension 09/29/2015   Postpartum gestational hypertension with first pregnancy. Not with any other pregnancies.      Hypertension    after delivery first child   UTI (urinary tract infection)    Vaginal Pap smear, abnormal    Past Surgical History:  Procedure Laterality Date   CHOLECYSTECTOMY     INDUCED ABORTION     Family History  Problem Relation Age of Onset   Hypertension Mother    Hypertension Maternal Grandfather    Cancer Maternal Grandfather    Cancer Paternal Grandmother    Social History   Tobacco Use   Smoking status: Former    Types:  Cigarettes    Start date: 2020   Smokeless tobacco: Never  Vaping Use   Vaping status: Never Used  Substance Use Topics   Alcohol use: No   Drug use: No   Allergies  Allergen Reactions   Bee Pollen Itching   Tomato Itching   Current Outpatient Medications on File Prior to Visit  Medication Sig Dispense Refill   fluticasone (FLONASE) 50 MCG/ACT nasal spray Place 1 spray into both nostrils daily. (Patient not taking: Reported on 11/10/2022) 47.4 mL 1   No current facility-administered medications on file prior to visit.   Indications for ASA therapy (per uptodate) One of the following: Previous pregnancy with preeclampsia, especially early onset and with an adverse outcome No Multifetal gestation No Chronic hypertension Yes Type 1 or 2 diabetes mellitus No Chronic kidney disease No Autoimmune disease (antiphospholipid syndrome, systemic lupus erythematosus) No  Two or more of the following: Nulliparity No Obesity (body mass index >30 kg/m2) Yes  Family history of preeclampsia in mother or sister No Age >=35 years Yes Sociodemographic characteristics (African American race, low socioeconomic level) Yes Personal risk factors (eg, previous pregnancy with low birth weight or small for gestational age infant, previous adverse pregnancy outcome [eg, stillbirth], interval >10 years between pregnancies) No  Indications for early GDM screening  First-degree relative with diabetes No BMI >30kg/m2 Yes Age > 35 No Previous birth of an infant weighing >=4000 g No Gestational diabetes mellitus in a previous pregnancy Yes Glycated hemoglobin >=5.7 percent (39 mmol/mol), impaired glucose tolerance, or impaired fasting glucose on previous testing No Adrian-risk race/ethnicity (eg, African American, Latino, Native American, Asian American, Pacific Islander) Yes Previous stillbirth of unknown cause No Maternal birthweight > 9 lbs No History of cardiovascular disease Yes Hypertension or on  therapy for hypertension Yes Mcclellan-density lipoprotein cholesterol level <35 mg/dL (1.47 mmol/L) and/or a triglyceride level >250 mg/dL (8.29 mmol/L) No Polycystic ovary syndrome No Physical inactivity No Other clinical condition associated with insulin resistance (eg, severe obesity, acanthosis nigricans) No Current use of glucocorticoids No   Early screening tests: FBS, A1C, Random CBG, glucose challenge  Review of Systems Pertinent items noted in HPI and remainder of comprehensive ROS otherwise negative. Physical Exam:   Vitals:   11/10/22 1051  BP: 120/78  Pulse: 95  Weight: 185 lb 6.4 oz (84.1 kg)    System: General: well-developed, well-nourished female in no acute distress   Breasts:  Deferred, annual visit was recent   Skin: normal coloration and turgor, no rashes   Neurologic: oriented, normal, negative, normal mood   Extremities: normal strength, tone, and muscle mass, ROM of all joints is normal   HEENT PERRLA, extraocular movement intact and sclera clear, anicteric   Mouth/Teeth mucous membranes moist, pharynx normal without lesions and dental hygiene good   Neck supple and no masses   Cardiovascular: regular rate and rhythm   Respiratory:  no respiratory distress, normal breath sounds   Abdomen: soft, non-tender; bowel sounds normal; no masses,  no organomegaly      Assessment:    Pregnancy: [id number] Patient Active Problem List   Diagnosis Date Noted   History of gestational diabetes in prior pregnancy, currently pregnant 11/10/2022   Hypertension in pregnancy, antepartum 11/10/2022   Multigravida of advanced maternal age 58/13/2024   Obesity affecting pregnancy, antepartum 11/10/2022   Supervision of Trebilcock risk pregnancy, antepartum 10/24/2022   Hypertension 06/28/2022     Plan:   1. Supervision of Olberding risk pregnancy, antepartum - Doing well overall, no complaints - aspirin EC 81 MG tablet; Take 1 tablet (81 mg total) by mouth daily. Take after 12 weeks  for prevention of preeclampsia later in pregnancy  Dispense: 300 tablet; Refill: 2 - [place] MFM OB DETAIL +14 WK; Future - CBC/D/Plt+RPR+Rh+ABO+RubIgG... - Culture, OB Urine - Cervicovaginal ancillary only - HgB A1c - NIFEdipine (PROCARDIA-XL/NIFEDICAL-XL) 30 MG 24 hr tablet; Take 1 tablet (30 mg total) by mouth daily. Can increase to twice a day as needed for symptomatic contractions  Dispense: 30 tablet; Refill: 2 - Comprehensive metabolic panel - Protein / creatinine ratio, urine  2. [redacted] weeks gestation of pregnancy - discussed routine prenatal care - aspirin EC 81 MG tablet; Take 1 tablet (81 mg total) by mouth daily. Take after 12 weeks for prevention of preeclampsia later in pregnancy  Dispense: 300 tablet; Refill: 2 - [place] MFM OB DETAIL +14 WK; Future - CBC/D/Plt+RPR+Rh+ABO+RubIgG... - Culture, OB Urine - Cervicovaginal ancillary only - HgB A1c - Comprehensive  metabolic panel - Protein / creatinine ratio, urine  3. Pre-existing secondary hypertension during pregnancy, antepartum - discussed recommendation for ASA therapy - NIFEdipine (PROCARDIA-XL/NIFEDICAL-XL) 30 MG 24 hr tablet; Take 1 tablet (30 mg total) by mouth daily. Can increase to twice a day as needed for symptomatic contractions  Dispense: 30 tablet; Refill: 2 - Comprehensive metabolic panel - Protein / creatinine ratio, urine - CBC  4. Antepartum multigravida of advanced maternal age - Declined genetic screening - aspirin EC 81 MG tablet; Take 1 tablet (81 mg total) by mouth daily. Take after 12 weeks for prevention of preeclampsia later in pregnancy  Dispense: 300 tablet; Refill: 2 - HgB A1c  5. Obesity affecting pregnancy, antepartum, unspecified obesity type - Pregravid BMI 33.6 - aspirin EC 81 MG tablet; Take 1 tablet (81 mg total) by mouth daily. Take after 12 weeks for prevention of preeclampsia later in pregnancy  Dispense: 300 tablet; Refill: 2 - HgB A1c  6. History of gestational diabetes in prior pregnancy,  currently pregnant - Early screening indicated and discussed with patient - HgB A1c    Initial labs drawn. Continue prenatal vitamins. Genetic Screening discussed, First trimester screen, Quad screen, and NIPS: declined. Ultrasound discussed; fetal anatomic survey: ordered. Problem list reviewed and updated. The nature of [place] [facility] with multiple MDs and other Advanced Practice Providers was explained to patient; also emphasized that residents, students are part of our team. Routine obstetric precautions reviewed. Return in about 4 weeks (around 12/08/2022) for [facility], [email or web address]    Corlis Hove, NP    Faculty Practice Center for [employer], [facility]

## 2022-11-10 ENCOUNTER — Ambulatory Visit (INDEPENDENT_AMBULATORY_CARE_PROVIDER_SITE_OTHER): Payer: Medicaid Other | Admitting: Student

## 2022-11-10 ENCOUNTER — Other Ambulatory Visit (HOSPITAL_COMMUNITY)
Admission: RE | Admit: 2022-11-10 | Discharge: 2022-11-10 | Disposition: A | Discharge status: Home / Self Care | Payer: Medicaid Other | Source: Ambulatory Visit | Attending: Student | Admitting: Student

## 2022-11-10 ENCOUNTER — Ambulatory Visit (INDEPENDENT_AMBULATORY_CARE_PROVIDER_SITE_OTHER): Payer: Medicaid Other | Admitting: Licensed Clinical Social Worker

## 2022-11-10 VITALS — BP 120/78 | HR 95 | Wt 185.4 lb

## 2022-11-10 DIAGNOSIS — Z1339 Encounter for screening examination for other mental health and behavioral disorders: Secondary | ICD-10-CM

## 2022-11-10 DIAGNOSIS — O10419 Pre-existing secondary hypertension complicating pregnancy, unspecified trimester: Secondary | ICD-10-CM

## 2022-11-10 DIAGNOSIS — O0990 Supervision of high risk pregnancy, unspecified, unspecified trimester: Secondary | ICD-10-CM | POA: Insufficient documentation

## 2022-11-10 DIAGNOSIS — Z3A12 12 weeks gestation of pregnancy: Secondary | ICD-10-CM | POA: Diagnosis not present

## 2022-11-10 DIAGNOSIS — O09299 Supervision of pregnancy with other poor reproductive or obstetric history, unspecified trimester: Secondary | ICD-10-CM | POA: Insufficient documentation

## 2022-11-10 DIAGNOSIS — O09521 Supervision of elderly multigravida, first trimester: Secondary | ICD-10-CM | POA: Diagnosis not present

## 2022-11-10 DIAGNOSIS — O0991 Supervision of high risk pregnancy, unspecified, first trimester: Secondary | ICD-10-CM

## 2022-11-10 DIAGNOSIS — I159 Secondary hypertension, unspecified: Secondary | ICD-10-CM | POA: Diagnosis not present

## 2022-11-10 DIAGNOSIS — O99210 Obesity complicating pregnancy, unspecified trimester: Secondary | ICD-10-CM | POA: Insufficient documentation

## 2022-11-10 DIAGNOSIS — O09291 Supervision of pregnancy with other poor reproductive or obstetric history, first trimester: Secondary | ICD-10-CM | POA: Diagnosis not present

## 2022-11-10 DIAGNOSIS — O169 Unspecified maternal hypertension, unspecified trimester: Secondary | ICD-10-CM | POA: Insufficient documentation

## 2022-11-10 DIAGNOSIS — O09529 Supervision of elderly multigravida, unspecified trimester: Secondary | ICD-10-CM | POA: Insufficient documentation

## 2022-11-10 DIAGNOSIS — Z8632 Personal history of gestational diabetes: Secondary | ICD-10-CM | POA: Diagnosis not present

## 2022-11-10 DIAGNOSIS — O10919 Unspecified pre-existing hypertension complicating pregnancy, unspecified trimester: Secondary | ICD-10-CM | POA: Insufficient documentation

## 2022-11-10 DIAGNOSIS — O99211 Obesity complicating pregnancy, first trimester: Secondary | ICD-10-CM | POA: Diagnosis not present

## 2022-11-10 DIAGNOSIS — O10411 Pre-existing secondary hypertension complicating pregnancy, first trimester: Secondary | ICD-10-CM | POA: Diagnosis not present

## 2022-11-10 MED ORDER — ASPIRIN 81 MG PO TBEC
81.0000 mg | DELAYED_RELEASE_TABLET | Freq: Every day | ORAL | 2 refills | Status: AC
Start: 2022-11-10 — End: ?

## 2022-11-10 MED ORDER — NIFEDIPINE ER OSMOTIC RELEASE 30 MG PO TB24
30.0000 mg | ORAL_TABLET | Freq: Every day | ORAL | 2 refills | Status: AC
Start: 2022-11-10 — End: ?

## 2022-11-10 NOTE — Progress Notes (Signed)
ROB: Denies any concerns

## 2022-11-11 LAB — CBC/D/PLT+RPR+RH+ABO+RUBIGG...
Antibody Screen: NEGATIVE
Basophils Absolute: 0 10*3/uL (ref 0.0–0.2)
Basos: 0 %
EOS (ABSOLUTE): 0.2 10*3/uL (ref 0.0–0.4)
Eos: 2 %
HCV Ab: NONREACTIVE
HIV Screen 4th Generation wRfx: NONREACTIVE
Hematocrit: 37.7 % (ref 34.0–46.6)
Hemoglobin: 12.2 g/dL (ref 11.1–15.9)
Hepatitis B Surface Ag: NEGATIVE
Immature Grans (Abs): 0 10*3/uL (ref 0.0–0.1)
Immature Granulocytes: 0 %
Lymphocytes Absolute: 2.2 10*3/uL (ref 0.7–3.1)
Lymphs: 32 %
MCH: 28.4 pg (ref 26.6–33.0)
MCHC: 32.4 g/dL (ref 31.5–35.7)
MCV: 88 fL (ref 79–97)
Monocytes Absolute: 0.5 10*3/uL (ref 0.1–0.9)
Monocytes: 7 %
Neutrophils Absolute: 4.1 10*3/uL (ref 1.4–7.0)
Neutrophils: 59 %
Platelets: 263 10*3/uL (ref 150–450)
RBC: 4.29 x10E6/uL (ref 3.77–5.28)
RDW: 14.4 % (ref 11.7–15.4)
RPR Ser Ql: NONREACTIVE
Rh Factor: POSITIVE
Rubella Antibodies, IGG: 1.38 {index} (ref >0.99)
WBC: 7 10*3/uL (ref 3.4–10.8)

## 2022-11-11 LAB — COMPREHENSIVE METABOLIC PANEL
ALT: 8 IU/L (ref 0–32)
AST: 12 IU/L (ref 0–40)
Albumin: 3.8 g/dL — ABNORMAL LOW (ref 3.9–4.9)
Alkaline Phosphatase: 68 IU/L (ref 44–121)
BUN/Creatinine Ratio: 8 — ABNORMAL LOW (ref 9–23)
BUN: 5 mg/dL — ABNORMAL LOW (ref 6–20)
Bilirubin Total: 0.2 mg/dL (ref 0.0–1.2)
CO2: 21 mmol/L (ref 20–29)
Calcium: 9.2 mg/dL (ref 8.7–10.2)
Chloride: 101 mmol/L (ref 96–106)
Creatinine, Ser: 0.6 mg/dL (ref 0.57–1.00)
Globulin, Total: 3.2 g/dL (ref 1.5–4.5)
Glucose: 92 mg/dL (ref 70–99)
Potassium: 4.5 mmol/L (ref 3.5–5.2)
Sodium: 135 mmol/L (ref 134–144)
Total Protein: 7 g/dL (ref 6.0–8.5)
eGFR: 120 mL/min/{1.73_m2} (ref >59)

## 2022-11-11 LAB — HEMOGLOBIN A1C
Est. average glucose Bld gHb Est-mCnc: 111 mg/dL
Hgb A1c MFr Bld: 5.5 % (ref 4.8–5.6)

## 2022-11-11 LAB — HCV INTERPRETATION

## 2022-11-12 LAB — PROTEIN / CREATININE RATIO, URINE
Creatinine, Urine: 110.6 mg/dL
Protein, Ur: 9.3 mg/dL
Protein/Creat Ratio: 84 mg/g{creat} (ref 0–200)

## 2022-11-12 LAB — CULTURE, OB URINE

## 2022-11-12 LAB — URINE CULTURE, OB REFLEX: Organism ID, Bacteria: NO GROWTH

## 2022-11-13 LAB — CERVICOVAGINAL ANCILLARY ONLY
Chlamydia: NEGATIVE
Comment: NEGATIVE
Comment: NEGATIVE
Comment: NORMAL
Neisseria Gonorrhea: NEGATIVE
Trichomonas: NEGATIVE

## 2022-11-16 NOTE — BH Specialist Note (Signed)
Integrated Behavioral Health Initial In-Person Visit  MRN: [id number] Name: Erin Johnston  Number of Integrated Behavioral Health Clinician visits: 1 Session Start time:   1045am Session End time: 1100am Total time in minutes: 15 mins in person at [facility]   Types of Service: General Behavioral Integrated Care (BHI)  Interpretor:No. Interpretor Name and Language: none   Warm Hand Off Completed.        Subjective: Erin Johnston is a 37 y.o. female accompanied by n/a Patient was referred by Florene Route NP for new ob intro. Patient reports the following symptoms/concerns: no reported concerns  Duration of problem: n/a; Severity of problem: n/a  Objective: Mood: good and Affect: Appropriate Risk of harm to self or others: No plan to harm self or others  Life Context: Family and Social: Lives in [place] is supportive  School/Work: employed  Self-Care: n/a Life Changes: new pregnancy  Patient and/or Family's Strengths/Protective Factors: Concrete supports in place (healthy food, safe environments, etc.)  Goals Addressed: Patient will: Take prenatal vitamins  Prioritize rest   Keep scheduled prenatal vitamins   Progress towards Goals: Ongoing  Interventions: Interventions utilized: Psychoeducation and/or Health Education  Standardized Assessments completed: Not Needed  Patient and/or Family Response: Ms. Eimer completed new ob introduction    Gwyndolyn Saxon, LCSW

## 2022-11-28 ENCOUNTER — Ambulatory Visit: Payer: Medicaid Other | Admitting: Emergency Medicine

## 2022-11-28 VITALS — BP 128/85 | HR 90 | Ht 60.0 in | Wt 187.4 lb

## 2022-11-28 DIAGNOSIS — Z3A12 12 weeks gestation of pregnancy: Secondary | ICD-10-CM

## 2022-11-28 DIAGNOSIS — O09529 Supervision of elderly multigravida, unspecified trimester: Secondary | ICD-10-CM

## 2022-11-28 DIAGNOSIS — O0990 Supervision of high risk pregnancy, unspecified, unspecified trimester: Secondary | ICD-10-CM

## 2022-11-28 DIAGNOSIS — O99210 Obesity complicating pregnancy, unspecified trimester: Secondary | ICD-10-CM

## 2022-11-28 DIAGNOSIS — O10419 Pre-existing secondary hypertension complicating pregnancy, unspecified trimester: Secondary | ICD-10-CM

## 2022-11-28 DIAGNOSIS — Z202 Contact with and (suspected) exposure to infections with a predominantly sexual mode of transmission: Secondary | ICD-10-CM

## 2022-11-28 DIAGNOSIS — O26892 Other specified pregnancy related conditions, second trimester: Secondary | ICD-10-CM

## 2022-11-28 MED ORDER — ASPIRIN 81 MG PO TBEC
81.0000 mg | DELAYED_RELEASE_TABLET | Freq: Every day | ORAL | 2 refills | Status: DC
Start: 2022-11-28 — End: 2023-05-16

## 2022-11-28 MED ORDER — AZITHROMYCIN 500 MG PO TABS: 1000.0000 mg | ORAL_TABLET | Freq: Once | ORAL | 0 refills | Status: AC

## 2022-11-28 MED ORDER — GOODSENSE PRENATAL VITAMINS 28-0.8 MG PO TABS: 1.0000 | ORAL_TABLET | Freq: Every day | ORAL | 11 refills | Status: AC

## 2022-11-28 MED ORDER — NIFEDIPINE ER OSMOTIC RELEASE 30 MG PO TB24
30.0000 mg | ORAL_TABLET | Freq: Every day | ORAL | 2 refills | Status: DC
Start: 2022-11-28 — End: 2023-05-16

## 2022-11-28 NOTE — Progress Notes (Unsigned)
SUBJECTIVE:  37 y.o. female complains of white vaginal discharge and odor for 2-3 day(s). Pt also reports that partner tested positive for Chlamydia yesterday. Denies abnormal vaginal bleeding or significant pelvic pain or fever. No UTI symptoms. Denies history of known exposure to STD.  Patient's last menstrual period was 08/14/2022.  OBJECTIVE:  She appears well, afebrile. Urine dipstick: not done.  ASSESSMENT:  Vaginal Discharge  Vaginal Odor   PLAN:  GC, chlamydia, trichomonas, BVAG, CVAG probe sent to lab. Treatment: To be determined once lab results are received; Treated for Chlamydia exposure ROV prn if symptoms persist or worsen.

## 2022-11-29 ENCOUNTER — Other Ambulatory Visit (HOSPITAL_COMMUNITY)
Admission: RE | Admit: 2022-11-29 | Discharge: 2022-11-29 | Disposition: A | Discharge status: Home / Self Care | Payer: Medicaid Other | Source: Ambulatory Visit | Attending: Obstetrics and Gynecology | Admitting: Obstetrics and Gynecology

## 2022-11-29 DIAGNOSIS — Z202 Contact with and (suspected) exposure to infections with a predominantly sexual mode of transmission: Secondary | ICD-10-CM | POA: Insufficient documentation

## 2022-11-29 DIAGNOSIS — O0990 Supervision of high risk pregnancy, unspecified, unspecified trimester: Secondary | ICD-10-CM | POA: Insufficient documentation

## 2022-11-29 DIAGNOSIS — O26892 Other specified pregnancy related conditions, second trimester: Secondary | ICD-10-CM | POA: Insufficient documentation

## 2022-11-29 DIAGNOSIS — N898 Other specified noninflammatory disorders of vagina: Secondary | ICD-10-CM | POA: Insufficient documentation

## 2022-12-04 ENCOUNTER — Other Ambulatory Visit: Payer: Self-pay | Admitting: Obstetrics and Gynecology

## 2022-12-04 ENCOUNTER — Other Ambulatory Visit: Payer: Self-pay

## 2022-12-04 DIAGNOSIS — B9689 Other specified bacterial agents as the cause of diseases classified elsewhere: Secondary | ICD-10-CM

## 2022-12-04 LAB — CERVICOVAGINAL ANCILLARY ONLY
Bacterial Vaginitis (gardnerella): POSITIVE — AB
Candida Glabrata: NEGATIVE
Candida Vaginitis: NEGATIVE
Chlamydia: NEGATIVE
Comment: NEGATIVE
Comment: NEGATIVE
Comment: NEGATIVE
Comment: NEGATIVE
Comment: NEGATIVE
Comment: NORMAL
Neisseria Gonorrhea: NEGATIVE
Trichomonas: NEGATIVE

## 2022-12-04 MED ORDER — METRONIDAZOLE 500 MG PO TABS: 500.0000 mg | ORAL_TABLET | Freq: Two times a day (BID) | ORAL | 0 refills | Status: AC

## 2022-12-11 ENCOUNTER — Ambulatory Visit (INDEPENDENT_AMBULATORY_CARE_PROVIDER_SITE_OTHER): Payer: Medicaid Other | Admitting: Obstetrics and Gynecology

## 2022-12-11 ENCOUNTER — Encounter: Payer: Self-pay | Admitting: Obstetrics and Gynecology

## 2022-12-11 VITALS — BP 125/86 | HR 89 | Wt 192.0 lb

## 2022-12-11 DIAGNOSIS — O99210 Obesity complicating pregnancy, unspecified trimester: Secondary | ICD-10-CM

## 2022-12-11 DIAGNOSIS — Z8632 Personal history of gestational diabetes: Secondary | ICD-10-CM

## 2022-12-11 DIAGNOSIS — O0992 Supervision of high risk pregnancy, unspecified, second trimester: Secondary | ICD-10-CM | POA: Diagnosis not present

## 2022-12-11 DIAGNOSIS — O09522 Supervision of elderly multigravida, second trimester: Secondary | ICD-10-CM

## 2022-12-11 DIAGNOSIS — O10419 Pre-existing secondary hypertension complicating pregnancy, unspecified trimester: Secondary | ICD-10-CM

## 2022-12-11 DIAGNOSIS — O0990 Supervision of high risk pregnancy, unspecified, unspecified trimester: Secondary | ICD-10-CM

## 2022-12-11 DIAGNOSIS — Z3A17 17 weeks gestation of pregnancy: Secondary | ICD-10-CM

## 2022-12-11 DIAGNOSIS — O09299 Supervision of pregnancy with other poor reproductive or obstetric history, unspecified trimester: Secondary | ICD-10-CM

## 2022-12-11 NOTE — Progress Notes (Signed)
Subjective:  Erin Johnston is a 37 y.o. [id number] at [redacted]w[redacted]d being seen today for ongoing prenatal care.  She is currently monitored for the following issues for this Lyall-risk pregnancy and has Hypertension; Supervision of Liby risk pregnancy, antepartum; History of gestational diabetes in prior pregnancy, currently pregnant; Hypertension in pregnancy, antepartum; Multigravida of advanced maternal age; and Obesity affecting pregnancy, antepartum on their problem list.  Patient reports no complaints.  Contractions: Irritability. Vag. Bleeding: None.   . Denies leaking of fluid.   The following portions of the patient's history were reviewed and updated as appropriate: allergies, current medications, past family history, past medical history, past social history, past surgical history and problem list. Problem list updated.  Objective:   Vitals:   12/11/22 0835  BP: 125/86  Pulse: 89  Weight: 192 lb (87.1 kg)    Fetal Status: Fetal Heart Rate (bpm): 151         General:  Alert, oriented and cooperative. Patient is in no acute distress.  Skin: Skin is warm and dry. No rash noted.   Cardiovascular: Normal heart rate noted  Respiratory: Normal respiratory effort, no problems with respiration noted  Abdomen: Soft, gravid, appropriate for gestational age. Pain/Pressure: Absent     Pelvic:  Cervical exam deferred        Extremities: Normal range of motion.  Edema: None  Mental Status: Normal mood and affect. Normal behavior. Normal judgment and thought content.   Urinalysis:      Assessment and Plan:  Pregnancy: [id number] at [redacted]w[redacted]d  1. Supervision of Willinger risk pregnancy, antepartum Stable Anatomy scan next week Decline flu vaccine 2. Pre-existing secondary hypertension during pregnancy, antepartum Stable Continue with Procardia and BASA  3. Multigravida of advanced maternal age in second trimester Declined AFP   4. Obesity affecting pregnancy, antepartum, unspecified obesity  type Stable   5. History of gestational diabetes in prior pregnancy, currently pregnant Glucola at 28 weeks  Preterm labor symptoms and general obstetric precautions including but not limited to vaginal bleeding, contractions, leaking of fluid and fetal movement were reviewed in detail with the patient. Please refer to After Visit Summary for other counseling recommendations.  Return in about 4 weeks (around 01/08/2023) for OB visit, face to face, MD only.   Hermina Staggers, MD

## 2022-12-11 NOTE — Progress Notes (Signed)
ROB, Pt declined AFP

## 2022-12-19 ENCOUNTER — Other Ambulatory Visit: Payer: Self-pay | Admitting: *Deleted

## 2022-12-19 ENCOUNTER — Encounter: Payer: Self-pay | Admitting: *Deleted

## 2022-12-19 ENCOUNTER — Ambulatory Visit: Payer: Medicaid Other | Admitting: *Deleted

## 2022-12-19 ENCOUNTER — Ambulatory Visit: Payer: Medicaid Other | Attending: Student

## 2022-12-19 ENCOUNTER — Other Ambulatory Visit: Payer: Self-pay

## 2022-12-19 VITALS — BP 131/86 | HR 83

## 2022-12-19 DIAGNOSIS — Z3A12 12 weeks gestation of pregnancy: Secondary | ICD-10-CM | POA: Insufficient documentation

## 2022-12-19 DIAGNOSIS — O10012 Pre-existing essential hypertension complicating pregnancy, second trimester: Secondary | ICD-10-CM

## 2022-12-19 DIAGNOSIS — Z3A18 18 weeks gestation of pregnancy: Secondary | ICD-10-CM | POA: Diagnosis not present

## 2022-12-19 DIAGNOSIS — Z8632 Personal history of gestational diabetes: Secondary | ICD-10-CM | POA: Diagnosis not present

## 2022-12-19 DIAGNOSIS — O3412 Maternal care for benign tumor of corpus uteri, second trimester: Secondary | ICD-10-CM

## 2022-12-19 DIAGNOSIS — E669 Obesity, unspecified: Secondary | ICD-10-CM | POA: Diagnosis not present

## 2022-12-19 DIAGNOSIS — O09522 Supervision of elderly multigravida, second trimester: Secondary | ICD-10-CM

## 2022-12-19 DIAGNOSIS — O09299 Supervision of pregnancy with other poor reproductive or obstetric history, unspecified trimester: Secondary | ICD-10-CM | POA: Diagnosis not present

## 2022-12-19 DIAGNOSIS — O99212 Obesity complicating pregnancy, second trimester: Secondary | ICD-10-CM

## 2022-12-19 DIAGNOSIS — O10419 Pre-existing secondary hypertension complicating pregnancy, unspecified trimester: Secondary | ICD-10-CM | POA: Insufficient documentation

## 2022-12-19 DIAGNOSIS — O99210 Obesity complicating pregnancy, unspecified trimester: Secondary | ICD-10-CM | POA: Diagnosis not present

## 2022-12-19 DIAGNOSIS — O09529 Supervision of elderly multigravida, unspecified trimester: Secondary | ICD-10-CM | POA: Insufficient documentation

## 2022-12-19 DIAGNOSIS — O09292 Supervision of pregnancy with other poor reproductive or obstetric history, second trimester: Secondary | ICD-10-CM | POA: Diagnosis not present

## 2022-12-19 DIAGNOSIS — O0990 Supervision of high risk pregnancy, unspecified, unspecified trimester: Secondary | ICD-10-CM

## 2022-12-19 DIAGNOSIS — D259 Leiomyoma of uterus, unspecified: Secondary | ICD-10-CM

## 2023-01-08 ENCOUNTER — Encounter: Payer: Self-pay | Admitting: Obstetrics and Gynecology

## 2023-01-08 ENCOUNTER — Ambulatory Visit (INDEPENDENT_AMBULATORY_CARE_PROVIDER_SITE_OTHER): Payer: Medicaid Other | Admitting: Obstetrics and Gynecology

## 2023-01-08 VITALS — BP 118/80 | HR 80 | Wt 191.0 lb

## 2023-01-08 DIAGNOSIS — O99210 Obesity complicating pregnancy, unspecified trimester: Secondary | ICD-10-CM

## 2023-01-08 DIAGNOSIS — O09522 Supervision of elderly multigravida, second trimester: Secondary | ICD-10-CM

## 2023-01-08 DIAGNOSIS — O10919 Unspecified pre-existing hypertension complicating pregnancy, unspecified trimester: Secondary | ICD-10-CM

## 2023-01-08 DIAGNOSIS — O0990 Supervision of high risk pregnancy, unspecified, unspecified trimester: Secondary | ICD-10-CM

## 2023-01-08 DIAGNOSIS — O09299 Supervision of pregnancy with other poor reproductive or obstetric history, unspecified trimester: Secondary | ICD-10-CM

## 2023-01-08 DIAGNOSIS — Z3A21 21 weeks gestation of pregnancy: Secondary | ICD-10-CM

## 2023-01-08 DIAGNOSIS — Z8632 Personal history of gestational diabetes: Secondary | ICD-10-CM

## 2023-01-08 NOTE — Progress Notes (Signed)
   PRENATAL VISIT NOTE  Subjective:  Erin Johnston is a 37 y.o. [id number] at [redacted]w[redacted]d being seen today for ongoing prenatal care.  She is currently monitored for the following issues for this Henes-risk pregnancy and has Hypertension; Supervision of Senters risk pregnancy, antepartum; History of gestational diabetes in prior pregnancy, currently pregnant; Hypertension in pregnancy, antepartum; Multigravida of advanced maternal age; and Obesity affecting pregnancy, antepartum on their problem list.  Patient reports no complaints.  Contractions: Not present. Vag. Bleeding: None.  Movement: Present. Denies leaking of fluid.   The following portions of the patient's history were reviewed and updated as appropriate: allergies, current medications, past family history, past medical history, past social history, past surgical history and problem list.   Objective:   Vitals:   01/08/23 1123  BP: 118/80  Pulse: 80  Weight: 191 lb (86.6 kg)    Fetal Status: Fetal Heart Rate (bpm): 145 Fundal Height: 22 cm Movement: Present     General:  Alert, oriented and cooperative. Patient is in no acute distress.  Skin: Skin is warm and dry. No rash noted.   Cardiovascular: Normal heart rate noted  Respiratory: Normal respiratory effort, no problems with respiration noted  Abdomen: Soft, gravid, appropriate for gestational age.  Pain/Pressure: Absent     Pelvic: Cervical exam deferred        Extremities: Normal range of motion.     Mental Status: Normal mood and affect. Normal behavior. Normal judgment and thought content.   Assessment and Plan:  Pregnancy: [id number] at [redacted]w[redacted]d 1. Supervision of Shimer risk pregnancy, antepartum Patient is doing well without complaints AFP today  2. Pre-existing hypertension during pregnancy, antepartum, unspecified pre-existing hypertension type Continue procardia Patient plans to start ASA Follow up growth ultrasound 11/19  3. History of gestational diabetes in prior  pregnancy, currently pregnant Glucola at 28 weeks Normal A1C at new ob  4. Obesity affecting pregnancy, antepartum, unspecified obesity type   5. Multigravida of advanced maternal age in second trimester   Preterm labor symptoms and general obstetric precautions including but not limited to vaginal bleeding, contractions, leaking of fluid and fetal movement were reviewed in detail with the patient. Please refer to After Visit Summary for other counseling recommendations.   Return in about 4 weeks (around 02/05/2023) for in person, ROB, Delpino risk.  Future Appointments  Date Time Provider Department Center  01/16/2023  9:15 AM [facility] NURSE [facility]  01/16/2023  9:30 AM [facility] US5 [facility]  01/30/2023  9:15 AM [facility] NURSE [facility]  01/30/2023  9:30 AM [facility] US2 [facility]  02/27/2023  9:15 AM [facility] NURSE [facility]  02/27/2023  9:30 AM [facility] US5 [facility]    Caidin Heidenreich, MD

## 2023-01-10 LAB — AFP, SERUM, OPEN SPINA BIFIDA
AFP MoM: 1.03
AFP Value: 62.2 ng/mL
Gest. Age on Collection Date: 21 wk
Maternal Age At EDD: 36 a
OSBR Risk 1 IN: 10000
Test Results:: NEGATIVE
Weight: 191 [lb_av]

## 2023-01-16 ENCOUNTER — Other Ambulatory Visit: Payer: Self-pay | Admitting: *Deleted

## 2023-01-16 ENCOUNTER — Ambulatory Visit: Payer: Medicaid Other | Attending: Maternal & Fetal Medicine

## 2023-01-16 ENCOUNTER — Ambulatory Visit: Payer: Medicaid Other | Admitting: *Deleted

## 2023-01-16 ENCOUNTER — Ambulatory Visit: Payer: Medicaid Other

## 2023-01-16 ENCOUNTER — Other Ambulatory Visit: Payer: Self-pay

## 2023-01-16 VITALS — BP 128/84 | HR 89

## 2023-01-16 DIAGNOSIS — O09292 Supervision of pregnancy with other poor reproductive or obstetric history, second trimester: Secondary | ICD-10-CM

## 2023-01-16 DIAGNOSIS — Z8632 Personal history of gestational diabetes: Secondary | ICD-10-CM | POA: Insufficient documentation

## 2023-01-16 DIAGNOSIS — O10912 Unspecified pre-existing hypertension complicating pregnancy, second trimester: Secondary | ICD-10-CM

## 2023-01-16 DIAGNOSIS — O35EXX0 Maternal care for other (suspected) fetal abnormality and damage, fetal genitourinary anomalies, not applicable or unspecified: Secondary | ICD-10-CM | POA: Diagnosis not present

## 2023-01-16 DIAGNOSIS — O99210 Obesity complicating pregnancy, unspecified trimester: Secondary | ICD-10-CM

## 2023-01-16 DIAGNOSIS — O10012 Pre-existing essential hypertension complicating pregnancy, second trimester: Secondary | ICD-10-CM | POA: Diagnosis not present

## 2023-01-16 DIAGNOSIS — O09522 Supervision of elderly multigravida, second trimester: Secondary | ICD-10-CM | POA: Diagnosis not present

## 2023-01-16 DIAGNOSIS — O10919 Unspecified pre-existing hypertension complicating pregnancy, unspecified trimester: Secondary | ICD-10-CM | POA: Insufficient documentation

## 2023-01-16 DIAGNOSIS — O09299 Supervision of pregnancy with other poor reproductive or obstetric history, unspecified trimester: Secondary | ICD-10-CM

## 2023-01-16 DIAGNOSIS — Z3A22 22 weeks gestation of pregnancy: Secondary | ICD-10-CM

## 2023-01-16 DIAGNOSIS — O3412 Maternal care for benign tumor of corpus uteri, second trimester: Secondary | ICD-10-CM | POA: Diagnosis not present

## 2023-01-16 DIAGNOSIS — D259 Leiomyoma of uterus, unspecified: Secondary | ICD-10-CM | POA: Diagnosis not present

## 2023-01-16 DIAGNOSIS — O99212 Obesity complicating pregnancy, second trimester: Secondary | ICD-10-CM

## 2023-01-16 DIAGNOSIS — O0990 Supervision of high risk pregnancy, unspecified, unspecified trimester: Secondary | ICD-10-CM | POA: Insufficient documentation

## 2023-01-16 DIAGNOSIS — E669 Obesity, unspecified: Secondary | ICD-10-CM

## 2023-01-30 ENCOUNTER — Ambulatory Visit: Payer: PRIVATE HEALTH INSURANCE

## 2023-02-07 ENCOUNTER — Ambulatory Visit (INDEPENDENT_AMBULATORY_CARE_PROVIDER_SITE_OTHER): Payer: Medicaid Other | Admitting: Obstetrics and Gynecology

## 2023-02-07 ENCOUNTER — Encounter: Payer: Self-pay | Admitting: Obstetrics and Gynecology

## 2023-02-07 VITALS — BP 116/75 | HR 98 | Wt 196.4 lb

## 2023-02-07 DIAGNOSIS — O99212 Obesity complicating pregnancy, second trimester: Secondary | ICD-10-CM

## 2023-02-07 DIAGNOSIS — O0990 Supervision of high risk pregnancy, unspecified, unspecified trimester: Secondary | ICD-10-CM

## 2023-02-07 DIAGNOSIS — O99210 Obesity complicating pregnancy, unspecified trimester: Secondary | ICD-10-CM

## 2023-02-07 DIAGNOSIS — Z3A25 25 weeks gestation of pregnancy: Secondary | ICD-10-CM

## 2023-02-07 DIAGNOSIS — E669 Obesity, unspecified: Secondary | ICD-10-CM

## 2023-02-07 DIAGNOSIS — O10919 Unspecified pre-existing hypertension complicating pregnancy, unspecified trimester: Secondary | ICD-10-CM

## 2023-02-07 DIAGNOSIS — O10012 Pre-existing essential hypertension complicating pregnancy, second trimester: Secondary | ICD-10-CM

## 2023-02-07 NOTE — Progress Notes (Addendum)
   PRENATAL VISIT NOTE  Subjective:  Erin Johnston is a 37 y.o. [id number] at [redacted]w[redacted]d being seen today for ongoing prenatal care.  She is currently monitored for the following issues for this Thaker-risk pregnancy and has Hypertension; Supervision of Mowbray risk pregnancy, antepartum; History of gestational diabetes in prior pregnancy, currently pregnant; Hypertension in pregnancy, antepartum; Multigravida of advanced maternal age; and Obesity affecting pregnancy, antepartum on their problem list.  Patient reports  left sciatic nerve pain .  Contractions: Not present. Vag. Bleeding: None.  Movement: Present. Denies leaking of fluid.   The following portions of the patient's history were reviewed and updated as appropriate: allergies, current medications, past family history, past medical history, past social history, past surgical history and problem list.   Objective:   Vitals:   02/07/23 1038  BP: 116/75  Pulse: 98  Weight: 196 lb 6.4 oz (89.1 kg)    Fetal Status: Fetal Heart Rate (bpm): 140 Fundal Height: 26 cm Movement: Present     General:  Alert, oriented and cooperative. Patient is in no acute distress.  Skin: Skin is warm and dry. No rash noted.   Cardiovascular: Normal heart rate noted  Respiratory: Normal respiratory effort, no problems with respiration noted  Abdomen: Soft, gravid, appropriate for gestational age.  Pain/Pressure: Present     Pelvic: Cervical exam deferred        Extremities: Normal range of motion.  Edema: None  Mental Status: Normal mood and affect. Normal behavior. Normal judgment and thought content.   Assessment and Plan:  Pregnancy: [id number] at [redacted]w[redacted]d 1. Supervision of Annett risk pregnancy, antepartum Patient is doing well Advised stretching exercises to help relieve sciatic nerve pain Third trimester labs and glucola next visit  2. Pre-existing hypertension during pregnancy, antepartum, unspecified pre-existing hypertension type Normotensive Continue  ASA Follow up ultrasound on 12/27  3. Obesity affecting pregnancy, antepartum, unspecified obesity type   4. [redacted] weeks gestation of pregnancy   Preterm labor symptoms and general obstetric precautions including but not limited to vaginal bleeding, contractions, leaking of fluid and fetal movement were reviewed in detail with the patient. Please refer to After Visit Summary for other counseling recommendations.   Return in about 3 weeks (around 02/28/2023) for in person, ROB, Tanguma risk, 2 hr glucola next visit.  Future Appointments  Date Time Provider Department Center  02/23/2023 11:15 AM [facility] NURSE [facility]  02/23/2023 11:30 AM [facility] US4 [facility]    Catalina Antigua, MD

## 2023-02-07 NOTE — Progress Notes (Signed)
Pt. Presents for rob. Pt. Is complaining of pain while lying on right side.

## 2023-02-23 ENCOUNTER — Ambulatory Visit: Payer: Medicaid Other | Admitting: *Deleted

## 2023-02-23 ENCOUNTER — Ambulatory Visit: Payer: Medicaid Other | Attending: Obstetrics

## 2023-02-23 ENCOUNTER — Ambulatory Visit: Payer: Medicaid Other | Attending: Obstetrics | Admitting: Obstetrics

## 2023-02-23 ENCOUNTER — Other Ambulatory Visit: Payer: Self-pay | Admitting: *Deleted

## 2023-02-23 VITALS — BP 139/103

## 2023-02-23 VITALS — BP 141/91 | HR 89

## 2023-02-23 DIAGNOSIS — O09529 Supervision of elderly multigravida, unspecified trimester: Secondary | ICD-10-CM

## 2023-02-23 DIAGNOSIS — O10919 Unspecified pre-existing hypertension complicating pregnancy, unspecified trimester: Secondary | ICD-10-CM

## 2023-02-23 DIAGNOSIS — D259 Leiomyoma of uterus, unspecified: Secondary | ICD-10-CM

## 2023-02-23 DIAGNOSIS — O09299 Supervision of pregnancy with other poor reproductive or obstetric history, unspecified trimester: Secondary | ICD-10-CM | POA: Diagnosis not present

## 2023-02-23 DIAGNOSIS — E669 Obesity, unspecified: Secondary | ICD-10-CM | POA: Diagnosis not present

## 2023-02-23 DIAGNOSIS — O10912 Unspecified pre-existing hypertension complicating pregnancy, second trimester: Secondary | ICD-10-CM | POA: Insufficient documentation

## 2023-02-23 DIAGNOSIS — O09522 Supervision of elderly multigravida, second trimester: Secondary | ICD-10-CM | POA: Diagnosis not present

## 2023-02-23 DIAGNOSIS — O09292 Supervision of pregnancy with other poor reproductive or obstetric history, second trimester: Secondary | ICD-10-CM | POA: Diagnosis not present

## 2023-02-23 DIAGNOSIS — O3412 Maternal care for benign tumor of corpus uteri, second trimester: Secondary | ICD-10-CM

## 2023-02-23 DIAGNOSIS — Z3A27 27 weeks gestation of pregnancy: Secondary | ICD-10-CM

## 2023-02-23 DIAGNOSIS — O0990 Supervision of high risk pregnancy, unspecified, unspecified trimester: Secondary | ICD-10-CM | POA: Insufficient documentation

## 2023-02-23 DIAGNOSIS — O09523 Supervision of elderly multigravida, third trimester: Secondary | ICD-10-CM

## 2023-02-23 DIAGNOSIS — O10012 Pre-existing essential hypertension complicating pregnancy, second trimester: Secondary | ICD-10-CM

## 2023-02-23 DIAGNOSIS — O10013 Pre-existing essential hypertension complicating pregnancy, third trimester: Secondary | ICD-10-CM

## 2023-02-23 DIAGNOSIS — Z8632 Personal history of gestational diabetes: Secondary | ICD-10-CM | POA: Diagnosis not present

## 2023-02-23 DIAGNOSIS — O99212 Obesity complicating pregnancy, second trimester: Secondary | ICD-10-CM | POA: Diagnosis not present

## 2023-02-23 DIAGNOSIS — O99210 Obesity complicating pregnancy, unspecified trimester: Secondary | ICD-10-CM | POA: Diagnosis not present

## 2023-02-23 DIAGNOSIS — O10913 Unspecified pre-existing hypertension complicating pregnancy, third trimester: Secondary | ICD-10-CM

## 2023-02-23 NOTE — Progress Notes (Signed)
MFM Note  Erin Johnston is currently at 27 weeks and 4 days.  She has been followed due to advanced maternal age and chronic hypertension treated with nifedipine 30 mg daily.    Her blood pressures today were 141/91 and 139/103.  She denies any signs or symptoms of preeclampsia.  She was informed that the fetal growth (2 pounds 11 ounces, 68th percentile) appears appropriate for her gestational age.  Borderline polyhydramnios with a maximal vertical pocket of 9 cm was noted today.  The patient was advised to continue monitoring her blood pressures at home.  She reports that her blood pressures at home have been within normal limits.    Should her blood pressures remain elevated, her dosage of nifedipine may have to be increased to help her achieve better blood pressure control.    Due to chronic hypertension, we will start weekly fetal testing at 32 weeks.    She will return in 4 weeks for a BPP and growth scan.    The patient stated that all of her questions were answered today.    Preeclampsia precautions were reviewed.  A total of 20 minutes was spent counseling and coordinating the care for this patient.  Greater than 50% of the time was spent in direct face-to-face contact.

## 2023-02-26 ENCOUNTER — Other Ambulatory Visit: Payer: Medicaid Other

## 2023-02-26 ENCOUNTER — Encounter: Payer: Medicaid Other | Admitting: Obstetrics

## 2023-02-27 ENCOUNTER — Ambulatory Visit: Payer: PRIVATE HEALTH INSURANCE

## 2023-02-28 NOTE — L&D Delivery Note (Signed)
 Delivery Note [name] is a 38 y.o. [id number] at [redacted]w[redacted]d admitted for IOL for [facility].   GBS Status:  Negative/-- (02/18 1603)  Labor course: Initial SVE: 1/50/-2. Augmentation with: AROM, Pitocin, Cytotec, and IP Foley. She then progressed to 9cms w/thick ant lip.  Lip reduced w/1 push.  ROM: 16h 27m with clear fluid  Birth: After a 10 minute 2nd stage, she delivered a Live born female  Birth Weight:   APGAR: 8, 9  Newborn Delivery   Birth date/time: 05/15/2021 03:38:05 Delivery type: Vaginal, Spontaneous        Delivered via spontaneous vaginal delivery (Presentation: ROA ). Nuchal cord present: Yes. X3, reduced prior to delivery. Shoulders and body delivered in usual fashion. Infant placed directly on mom's abdomen for bonding/skin-to-skin, baby dried and stimulated. Cord clamped x 2 after 1 minute and cut by pt's mom.  Cord blood collected. Placenta delivered-Spontaneous with 3 vessels. 20u Pitocin in 500cc LR given as a bolus with delivery of placenta.  TXA 1gm give prophylacticly. Fundus firm with massage. Placenta inspected and appears to be intact with a 3 VC.  Sponge and instrument count were correct x2.  Intrapartum complications:  None Anesthesia:  epidural Lacerations:  none Suture Repair:  EBL (mL):125.00    Mom to postpartum.  Baby to Couplet care / Skin to Skin. Placenta to L&D   Plans to Breastfeed Contraception: Nexplanon Circumcision: wants inpatient  Note sent to [facility]: [facility] for pp visit.  Delivery Report:  Review the Delivery Report for details.     Signed: Jacklyn Shell, DNP,CNM 05/15/2023, 4:15 AM

## 2023-03-12 ENCOUNTER — Ambulatory Visit (INDEPENDENT_AMBULATORY_CARE_PROVIDER_SITE_OTHER): Payer: Medicaid Other | Admitting: Advanced Practice Midwife

## 2023-03-12 ENCOUNTER — Encounter: Payer: Self-pay | Admitting: Advanced Practice Midwife

## 2023-03-12 VITALS — BP 129/87 | HR 96 | Wt 199.4 lb

## 2023-03-12 DIAGNOSIS — E669 Obesity, unspecified: Secondary | ICD-10-CM

## 2023-03-12 DIAGNOSIS — O10013 Pre-existing essential hypertension complicating pregnancy, third trimester: Secondary | ICD-10-CM

## 2023-03-12 DIAGNOSIS — M5431 Sciatica, right side: Secondary | ICD-10-CM

## 2023-03-12 DIAGNOSIS — O10919 Unspecified pre-existing hypertension complicating pregnancy, unspecified trimester: Secondary | ICD-10-CM

## 2023-03-12 DIAGNOSIS — Z3A30 30 weeks gestation of pregnancy: Secondary | ICD-10-CM

## 2023-03-12 DIAGNOSIS — O0990 Supervision of high risk pregnancy, unspecified, unspecified trimester: Secondary | ICD-10-CM

## 2023-03-12 DIAGNOSIS — O99213 Obesity complicating pregnancy, third trimester: Secondary | ICD-10-CM

## 2023-03-12 DIAGNOSIS — O99210 Obesity complicating pregnancy, unspecified trimester: Secondary | ICD-10-CM

## 2023-03-12 DIAGNOSIS — O26893 Other specified pregnancy related conditions, third trimester: Secondary | ICD-10-CM

## 2023-03-12 DIAGNOSIS — M5432 Sciatica, left side: Secondary | ICD-10-CM

## 2023-03-12 NOTE — Progress Notes (Signed)
   PRENATAL VISIT NOTE  Subjective:  Erin Johnston is a 38 y.o. [id number] at [redacted]w[redacted]d being seen today for ongoing prenatal care.  She is currently monitored for the following issues for this Marcello-risk pregnancy and has Hypertension; Supervision of [place] risk pregnancy, antepartum; History of gestational diabetes in prior pregnancy, currently pregnant; Chronic hypertension affecting pregnancy; Multigravida of advanced maternal age; and Obesity affecting pregnancy, antepartum on their problem list.  Patient reports  pain in low back, radiating down buttocks and back of leg, bilateral.  .  Contractions: Not present. Vag. Bleeding: None.  Movement: Present. Denies leaking of fluid.   The following portions of the patient's history were reviewed and updated as appropriate: allergies, current medications, past family history, past medical history, past social history, past surgical history and problem list.   Objective:   Vitals:   03/12/23 1344  BP: 129/87  Pulse: 96  Weight: 199 lb 6.4 oz (90.4 kg)    Fetal Status: Fetal Heart Rate (bpm): 131   Movement: Present     General:  Alert, oriented and cooperative. Patient is in no acute distress.  Skin: Skin is warm and dry. No rash noted.   Cardiovascular: Normal heart rate noted  Respiratory: Normal respiratory effort, no problems with respiration noted  Abdomen: Soft, gravid, appropriate for gestational age.  Pain/Pressure: Present     Pelvic: Cervical exam deferred        Extremities: Normal range of motion.  Edema: None  Mental Status: Normal mood and affect. Normal behavior. Normal judgment and thought content.   Assessment and Plan:  Pregnancy: [id number] at [redacted]w[redacted]d 1. Chronic hypertension affecting pregnancy (Primary) --Procardia  30 mg XL daily, normal baseline labs --BP wnl today, no s/sx of PEC  2. Supervision of [place] risk pregnancy, antepartum --Anticipatory guidance about next visits/weeks of pregnancy given.   3. Obesity affecting  pregnancy, antepartum, unspecified obesity type   4. [redacted] weeks gestation of pregnancy   5. Bilateral sciatica --Rest/ice/heat/warm bath/increase PO fluids/Tylenol /pregnancy support belt  --Discussed how to get pregnancy belt ([employer] if insurance covers, or  [email or web address]).   - Ambulatory referral to Physical Therapy   Preterm labor symptoms and general obstetric precautions including but not limited to vaginal bleeding, contractions, leaking of fluid and fetal movement were reviewed in detail with the patient. Please refer to After Visit Summary for other counseling recommendations.   No follow-ups on file.  Future Appointments  Date Time Provider Department Center  03/23/2023  3:30 PM [facility] US5 [facility]  03/26/2023  1:50 PM Constant, Peggy, MD [facility] None  03/30/2023  3:30 PM [facility] US5 [facility]  04/06/2023  3:30 PM [facility] US4 [facility]  04/13/2023  3:30 PM [facility] US4 [facility]    Olam Boards, CNM

## 2023-03-19 ENCOUNTER — Inpatient Hospital Stay (HOSPITAL_COMMUNITY)
Admission: AD | Admit: 2023-03-19 | Discharge: 2023-03-19 | Disposition: A | Discharge status: Home / Self Care | Payer: Medicaid Other | Attending: Obstetrics and Gynecology | Admitting: Obstetrics and Gynecology

## 2023-03-19 ENCOUNTER — Other Ambulatory Visit: Payer: Self-pay

## 2023-03-19 ENCOUNTER — Encounter (HOSPITAL_COMMUNITY): Payer: Self-pay | Admitting: Obstetrics and Gynecology

## 2023-03-19 DIAGNOSIS — R000 Tachycardia, unspecified: Secondary | ICD-10-CM | POA: Insufficient documentation

## 2023-03-19 DIAGNOSIS — O99613 Diseases of the digestive system complicating pregnancy, third trimester: Secondary | ICD-10-CM | POA: Diagnosis not present

## 2023-03-19 DIAGNOSIS — Z79899 Other long term (current) drug therapy: Secondary | ICD-10-CM | POA: Diagnosis not present

## 2023-03-19 DIAGNOSIS — R0602 Shortness of breath: Secondary | ICD-10-CM | POA: Insufficient documentation

## 2023-03-19 DIAGNOSIS — R002 Palpitations: Secondary | ICD-10-CM

## 2023-03-19 DIAGNOSIS — O0990 Supervision of high risk pregnancy, unspecified, unspecified trimester: Secondary | ICD-10-CM

## 2023-03-19 DIAGNOSIS — Z3A31 31 weeks gestation of pregnancy: Secondary | ICD-10-CM

## 2023-03-19 DIAGNOSIS — M5432 Sciatica, left side: Secondary | ICD-10-CM | POA: Diagnosis not present

## 2023-03-19 DIAGNOSIS — O26893 Other specified pregnancy related conditions, third trimester: Secondary | ICD-10-CM | POA: Diagnosis not present

## 2023-03-19 DIAGNOSIS — K219 Gastro-esophageal reflux disease without esophagitis: Secondary | ICD-10-CM

## 2023-03-19 DIAGNOSIS — M5431 Sciatica, right side: Secondary | ICD-10-CM | POA: Diagnosis not present

## 2023-03-19 DIAGNOSIS — O09299 Supervision of pregnancy with other poor reproductive or obstetric history, unspecified trimester: Secondary | ICD-10-CM

## 2023-03-19 DIAGNOSIS — R202 Paresthesia of skin: Secondary | ICD-10-CM | POA: Diagnosis not present

## 2023-03-19 DIAGNOSIS — O09522 Supervision of elderly multigravida, second trimester: Secondary | ICD-10-CM

## 2023-03-19 DIAGNOSIS — O99210 Obesity complicating pregnancy, unspecified trimester: Secondary | ICD-10-CM

## 2023-03-19 DIAGNOSIS — O10913 Unspecified pre-existing hypertension complicating pregnancy, third trimester: Secondary | ICD-10-CM | POA: Diagnosis not present

## 2023-03-19 LAB — URINALYSIS, ROUTINE W REFLEX MICROSCOPIC
Bilirubin Urine: NEGATIVE
Glucose, UA: NEGATIVE mg/dL
Hgb urine dipstick: NEGATIVE
Ketones, ur: NEGATIVE mg/dL
Leukocytes,Ua: NEGATIVE
Nitrite: NEGATIVE
Protein, ur: NEGATIVE mg/dL
Specific Gravity, Urine: 1.013 (ref 1.005–1.030)
pH: 6 (ref 5.0–8.0)

## 2023-03-19 MED ORDER — PANTOPRAZOLE SODIUM 40 MG PO TBEC: 40.0000 mg | DELAYED_RELEASE_TABLET | Freq: Every day | ORAL | 3 refills | Status: DC

## 2023-03-19 NOTE — MAU Note (Signed)
Erin Johnston is a 38 y.o. at [redacted]w[redacted]d here in MAU reporting: she has intermittent abdominal tightening, takes Procardia but didn't hasn't taken today.  Reports doesn't feel like a ctxs. Also reports she feels like her heart is racing intermittently.  Denies VB or LOF.  Endorses +FM.  LMP: NA Onset of complaint: today Pain score: 6 Vitals:   03/19/23 1228  BP: (!) 142/95  Pulse: (!) 106  Resp: 18  Temp: 98.3 F (36.8 C)  SpO2: 96%     FHT: 145 bpm  Lab orders placed from triage: UA

## 2023-03-19 NOTE — MAU Provider Note (Signed)
History     [id number]  Arrival date and time: 03/19/23 1137    Chief Complaint  Patient presents with   Contractions   Rapid Heart Rate     HPI Erin Johnston is a 38 y.o. at [redacted]w[redacted]d with PMHx notable for cHTN on procardia, who presents for multiple concerns.   Main presenting issue is a feeling of her heart racing Primarily happens with exertion, has also happened when she's showering and when standing at the stove cooking No chest pain, though does endorse some shortness of breath with the episodes Can never predict the episodes Only taking Procardia intermittently, wondering if it could be related to that Also not taking baby ASA  Having lots of heartburn, not taking anything  Also having issues with her sciatic nerves Mostly on L side but sometimes on the R as well Has classic electrical tingling going down her leg  Denies vaginal bleeding, leaking fluid, contractions Normal fetal movement   O/Positive/-- (09/13 1147)  OB History     Gravida  8   Para  4   Term  4   Preterm      AB  3   Living  4      SAB  1   IAB  2   Ectopic  0   Multiple  0   Live Births  4           Past Medical History:  Diagnosis Date   History of gestational hypertension 09/29/2015   Postpartum gestational hypertension with first pregnancy. Not with any other pregnancies.      Hypertension    after delivery first child   UTI (urinary tract infection)    Vaginal Pap smear, abnormal     Past Surgical History:  Procedure Laterality Date   CHOLECYSTECTOMY     INDUCED ABORTION     WISDOM TOOTH EXTRACTION      Family History  Problem Relation Age of Onset   Diabetes Mother    Asthma Mother    Hypertension Mother    Hypertension Maternal Grandfather    Cancer Maternal Grandfather    Diabetes Paternal Grandmother    Cancer Paternal Grandmother    Heart disease Neg Hx     Social History   Socioeconomic History   Marital status: Single    Spouse  name: Not on file   Number of children: 4   Years of education: Not on file   Highest education level: Not on file  Occupational History   Not on file  Tobacco Use   Smoking status: Former    Types: Cigarettes    Start date: 2020   Smokeless tobacco: Never  Vaping Use   Vaping status: Never Used  Substance and Sexual Activity   Alcohol use: No   Drug use: No   Sexual activity: Not Currently    Birth control/protection: None  Other Topics Concern   Not on file  Social History Narrative   Not on file   Social Drivers of Health   Financial Resource Strain: Not on file  Food Insecurity: No Food Insecurity (03/19/2023)   Hunger Vital Sign    Worried About Running Out of Food in the Last Year: Never true    Ran Out of Food in the Last Year: Never true  Transportation Needs: Unknown (03/19/2023)   PRAPARE - Administrator, Civil Service (Medical): Not on file    Lack of Transportation (Non-Medical): No  Physical Activity: Not  on file  Stress: Not on file  Social Connections: Not on file  Intimate Partner Violence: Not At Risk (03/19/2023)   Humiliation, Afraid, Rape, and Kick questionnaire    Fear of Current or Ex-Partner: No    Emotionally Abused: No    Physically Abused: No    Sexually Abused: No    Allergies  Allergen Reactions   Bee Pollen Itching   Tomato Itching    No current facility-administered medications on file prior to encounter.   Current Outpatient Medications on File Prior to Encounter  Medication Sig Dispense Refill   aspirin EC 81 MG tablet Take 1 tablet (81 mg total) by mouth daily. Take after 12 weeks for prevention of preeclampsia later in pregnancy 300 tablet 2   NIFEdipine (PROCARDIA-XL/NIFEDICAL-XL) 30 MG 24 hr tablet Take 1 tablet (30 mg total) by mouth daily. Can increase to twice a day as needed for symptomatic contractions 30 tablet 2   fluticasone (FLONASE) 50 MCG/ACT nasal spray Place 1 spray into both nostrils daily. (Patient  not taking: Reported on 03/12/2023) 47.4 mL 1   Prenatal Vit-Fe Fumarate-FA (GOODSENSE PRENATAL VITAMINS) 28-0.8 MG TABS Take 1 tablet by mouth daily. 30 tablet 11     ROS Pertinent positives and negative per HPI, all others reviewed and negative  Physical Exam   BP 134/79   Pulse (!) 105   Temp 98.3 F (36.8 C) (Oral)   Resp 18   Ht 5' (1.524 m)   Wt 90.9 kg   LMP 08/14/2022 (Exact Date)   SpO2 96%   BMI 39.14 kg/m   Patient Vitals for the past 24 hrs:  BP Temp Temp src Pulse Resp SpO2 Height Weight  03/19/23 1259 134/79 -- -- (!) 105 -- -- -- --  03/19/23 1228 (!) 142/95 98.3 F (36.8 C) Oral (!) 106 18 96 % -- --  03/19/23 1222 -- -- -- -- -- -- 5' (1.524 m) 90.9 kg    Physical Exam Vitals reviewed.  Constitutional:      General: She is not in acute distress.    Appearance: She is well-developed. She is not diaphoretic.  Eyes:     General: No scleral icterus. Pulmonary:     Effort: Pulmonary effort is normal. No respiratory distress.  Skin:    General: Skin is warm and dry.  Neurological:     Mental Status: She is alert.     Coordination: Coordination normal.      Cervical Exam    Bedside Ultrasound Not performed.  My interpretation: n/a  FHT Baseline: 135 bpm Variability: Good {> 6 bpm) Accelerations: Reactive Decelerations: Absent Uterine activity: one, otherwise flat Cat: I  Labs Results for orders placed or performed during the hospital encounter of 03/19/23 (from the past 24 hours)  Urinalysis, Routine w reflex microscopic -Urine, Clean Catch     Status: None   Collection Time: 03/19/23 12:32 PM  Result Value Ref Range   Color, Urine YELLOW YELLOW   APPearance CLEAR CLEAR   Specific Gravity, Urine 1.013 1.005 - 1.030   pH 6.0 5.0 - 8.0   Glucose, UA NEGATIVE NEGATIVE mg/dL   Hgb urine dipstick NEGATIVE NEGATIVE   Bilirubin Urine NEGATIVE NEGATIVE   Ketones, ur NEGATIVE NEGATIVE mg/dL   Protein, ur NEGATIVE NEGATIVE mg/dL   Nitrite  NEGATIVE NEGATIVE   Leukocytes,Ua NEGATIVE NEGATIVE    Imaging No results found.  MAU Course  Procedures Lab Orders         Urinalysis, Routine w  reflex microscopic -Urine, Clean Catch    Meds ordered this encounter  Medications   pantoprazole (PROTONIX) 40 MG tablet    Sig: Take 1 tablet (40 mg total) by mouth daily.    Dispense:  90 tablet    Refill:  3   Imaging Orders  No imaging studies ordered today    MDM Moderate (Level 3-4)  Assessment and Plan  # Palpitations #[redacted] weeks gestation of pregnancy Intermittent palpitations, suspect related to advancing gestational age and deconditioning in addition to [place] level of anxiety and stress but will refer to Cardio OB for completeness sake, discussed with patient that they will contact her to set up an appt and also to expect a Zio patch ahead of time so results can be reviewed in clinic.   #Reflux in pregnancy Rx sent for pantoprazole  #Chronic hypertension Mostly normotensive, encouraged to take Nifedipine and ASA as most effective way to avoid complications in her pregnancy.   #FWB FHT Cat I NST: Reactive   Dispo: discharged to home in stable condition   Discharge Instructions     AMB Referral to Cardio Obstetrics   Complete by: As directed    Palpitations, eval for SVT       Venora Maples, MD/MPH 03/19/23 1:41 PM  Allergies as of 03/19/2023       Reactions   Bee Pollen Itching   Tomato Itching        Medication List     TAKE these medications    aspirin EC 81 MG tablet Take 1 tablet (81 mg total) by mouth daily. Take after 12 weeks for prevention of preeclampsia later in pregnancy   fluticasone 50 MCG/ACT nasal spray Commonly known as: FLONASE Place 1 spray into both nostrils daily.   GoodSense Prenatal Vitamins 28-0.8 MG Tabs Take 1 tablet by mouth daily.   NIFEdipine 30 MG 24 hr tablet Commonly known as: PROCARDIA-XL/NIFEDICAL-XL Take 1 tablet (30 mg total) by mouth daily. Can  increase to twice a day as needed for symptomatic contractions   pantoprazole 40 MG tablet Commonly known as: Protonix Take 1 tablet (40 mg total) by mouth daily.

## 2023-03-23 ENCOUNTER — Ambulatory Visit: Payer: Medicaid Other | Attending: Obstetrics

## 2023-03-23 ENCOUNTER — Other Ambulatory Visit: Payer: Self-pay

## 2023-03-23 ENCOUNTER — Ambulatory Visit: Payer: Medicaid Other

## 2023-03-23 VITALS — BP 122/77

## 2023-03-23 DIAGNOSIS — Z3A31 31 weeks gestation of pregnancy: Secondary | ICD-10-CM | POA: Diagnosis not present

## 2023-03-23 DIAGNOSIS — D259 Leiomyoma of uterus, unspecified: Secondary | ICD-10-CM

## 2023-03-23 DIAGNOSIS — O99210 Obesity complicating pregnancy, unspecified trimester: Secondary | ICD-10-CM | POA: Insufficient documentation

## 2023-03-23 DIAGNOSIS — E669 Obesity, unspecified: Secondary | ICD-10-CM | POA: Diagnosis not present

## 2023-03-23 DIAGNOSIS — O09523 Supervision of elderly multigravida, third trimester: Secondary | ICD-10-CM

## 2023-03-23 DIAGNOSIS — O09529 Supervision of elderly multigravida, unspecified trimester: Secondary | ICD-10-CM | POA: Insufficient documentation

## 2023-03-23 DIAGNOSIS — R002 Palpitations: Secondary | ICD-10-CM | POA: Diagnosis not present

## 2023-03-23 DIAGNOSIS — O10013 Pre-existing essential hypertension complicating pregnancy, third trimester: Secondary | ICD-10-CM

## 2023-03-23 DIAGNOSIS — O10919 Unspecified pre-existing hypertension complicating pregnancy, unspecified trimester: Secondary | ICD-10-CM | POA: Insufficient documentation

## 2023-03-23 DIAGNOSIS — O09293 Supervision of pregnancy with other poor reproductive or obstetric history, third trimester: Secondary | ICD-10-CM

## 2023-03-23 DIAGNOSIS — O3413 Maternal care for benign tumor of corpus uteri, third trimester: Secondary | ICD-10-CM | POA: Diagnosis not present

## 2023-03-23 DIAGNOSIS — Z8632 Personal history of gestational diabetes: Secondary | ICD-10-CM | POA: Insufficient documentation

## 2023-03-23 DIAGNOSIS — O99213 Obesity complicating pregnancy, third trimester: Secondary | ICD-10-CM

## 2023-03-23 DIAGNOSIS — O09299 Supervision of pregnancy with other poor reproductive or obstetric history, unspecified trimester: Secondary | ICD-10-CM | POA: Diagnosis not present

## 2023-03-23 DIAGNOSIS — O0990 Supervision of high risk pregnancy, unspecified, unspecified trimester: Secondary | ICD-10-CM | POA: Diagnosis not present

## 2023-03-23 NOTE — Progress Notes (Unsigned)
Enrolled patient for a 7 day Zio XT monitor to be mailed to patients home.

## 2023-03-23 NOTE — Progress Notes (Signed)
7 day Zio order placed per request from Dr. Servando Salina and referring provider. See below:  Hi Kardie,   Hope this is the right way to do this, just wanted to send an FYI that I referred patient from MAU for palpitations, wanted to message you so that she could get a Zio patch ahead of time.   Thanks  [facility],  Please help [place] connect with the patient to send her monitor.    KT

## 2023-03-26 ENCOUNTER — Ambulatory Visit (INDEPENDENT_AMBULATORY_CARE_PROVIDER_SITE_OTHER): Payer: Medicaid Other | Admitting: Obstetrics and Gynecology

## 2023-03-26 ENCOUNTER — Encounter: Payer: Self-pay | Admitting: Obstetrics and Gynecology

## 2023-03-26 VITALS — BP 131/88 | HR 103 | Wt 206.7 lb

## 2023-03-26 DIAGNOSIS — I159 Secondary hypertension, unspecified: Secondary | ICD-10-CM

## 2023-03-26 DIAGNOSIS — O0990 Supervision of high risk pregnancy, unspecified, unspecified trimester: Secondary | ICD-10-CM

## 2023-03-26 DIAGNOSIS — O99210 Obesity complicating pregnancy, unspecified trimester: Secondary | ICD-10-CM

## 2023-03-26 NOTE — Progress Notes (Addendum)
   PRENATAL VISIT NOTE  Subjective:  Erin Johnston is a 38 y.o. [id number] at [redacted]w[redacted]d being seen today for ongoing prenatal care.  She is currently monitored for the following issues for this [place]-risk pregnancy and has Hypertension; Supervision of [place] risk pregnancy, antepartum; History of gestational diabetes in prior pregnancy, currently pregnant; Chronic hypertension affecting pregnancy; Multigravida of advanced maternal age; and Obesity affecting pregnancy, antepartum on their problem list.  Patient reports no complaints.  Contractions: Not present. Vag. Bleeding: None.  Movement: Present. Denies leaking of fluid.   The following portions of the patient's history were reviewed and updated as appropriate: allergies, current medications, past family history, past medical history, past social history, past surgical history and problem list.   Objective:   Vitals:   03/26/23 1407  BP: 131/88  Pulse: (!) 103  Weight: 206 lb 11.2 oz (93.8 kg)    Fetal Status: Fetal Heart Rate (bpm): 140 Fundal Height: 32 cm Movement: Present     General:  Alert, oriented and cooperative. Patient is in no acute distress.  Skin: Skin is warm and dry. No rash noted.   Cardiovascular: Normal heart rate noted  Respiratory: Normal respiratory effort, no problems with respiration noted  Abdomen: Soft, gravid, appropriate for gestational age.  Pain/Pressure: Present     Pelvic: Cervical exam deferred        Extremities: Normal range of motion.  Edema: None  Mental Status: Normal mood and affect. Normal behavior. Normal judgment and thought content.   Assessment and Plan:  Pregnancy: [id number] at [redacted]w[redacted]d 1. Supervision of [place] risk pregnancy, antepartum (Primary) Patient is doing well without complaints Patient missed 2 hour glucola and agrees to 1 hour glucola today - Glucose tolerance, 1 hour List of exercises to ease sciatic nerve pain provided  2. Obesity affecting pregnancy, antepartum, unspecified obesity  type   3. Secondary hypertension Stable on procardia Continue ASA Follow up scans per MFM  Preterm labor symptoms and general obstetric precautions including but not limited to vaginal bleeding, contractions, leaking of fluid and fetal movement were reviewed in detail with the patient. Please refer to After Visit Summary for other counseling recommendations.   Return in about 2 weeks (around 04/09/2023) for in person, ROB, Pruss risk.  Future Appointments  Date Time Provider Department Center  03/30/2023  3:30 PM [facility] US5 [facility]  04/06/2023  3:30 PM [facility] US4 [facility]  04/13/2023  3:30 PM [facility] US4 [facility]    Catalina Antigua, MD

## 2023-03-26 NOTE — Progress Notes (Signed)
Pt presents for ROB. Still has sciatic nerve pain, has not got PT appt scheduled yet. No questions.

## 2023-03-28 LAB — RPR: RPR Ser Ql: NONREACTIVE

## 2023-03-28 LAB — CBC
Hematocrit: 35 % (ref 34.0–46.6)
Hemoglobin: 11.8 g/dL (ref 11.1–15.9)
MCH: 29.1 pg (ref 26.6–33.0)
MCHC: 33.7 g/dL (ref 31.5–35.7)
MCV: 86 fL (ref 79–97)
Platelets: 216 10*3/uL (ref 150–450)
RBC: 4.05 x10E6/uL (ref 3.77–5.28)
RDW: 13.6 % (ref 11.7–15.4)
WBC: 6.5 10*3/uL (ref 3.4–10.8)

## 2023-03-28 LAB — GLUCOSE TOLERANCE, 1 HOUR: Glucose, 1Hr PP: 94 mg/dL (ref 70–199)

## 2023-03-28 LAB — HIV ANTIBODY (ROUTINE TESTING W REFLEX): HIV Screen 4th Generation wRfx: NONREACTIVE

## 2023-03-29 ENCOUNTER — Encounter: Payer: Self-pay | Admitting: *Deleted

## 2023-03-29 DIAGNOSIS — O3410 Maternal care for benign tumor of corpus uteri, unspecified trimester: Secondary | ICD-10-CM | POA: Insufficient documentation

## 2023-03-30 ENCOUNTER — Ambulatory Visit: Payer: Medicaid Other | Attending: Obstetrics

## 2023-03-30 DIAGNOSIS — D259 Leiomyoma of uterus, unspecified: Secondary | ICD-10-CM

## 2023-03-30 DIAGNOSIS — O10919 Unspecified pre-existing hypertension complicating pregnancy, unspecified trimester: Secondary | ICD-10-CM | POA: Diagnosis not present

## 2023-03-30 DIAGNOSIS — Z3A32 32 weeks gestation of pregnancy: Secondary | ICD-10-CM

## 2023-03-30 DIAGNOSIS — O09293 Supervision of pregnancy with other poor reproductive or obstetric history, third trimester: Secondary | ICD-10-CM

## 2023-03-30 DIAGNOSIS — O09529 Supervision of elderly multigravida, unspecified trimester: Secondary | ICD-10-CM | POA: Insufficient documentation

## 2023-03-30 DIAGNOSIS — O3413 Maternal care for benign tumor of corpus uteri, third trimester: Secondary | ICD-10-CM | POA: Diagnosis not present

## 2023-03-30 DIAGNOSIS — E669 Obesity, unspecified: Secondary | ICD-10-CM | POA: Diagnosis not present

## 2023-03-30 DIAGNOSIS — O10013 Pre-existing essential hypertension complicating pregnancy, third trimester: Secondary | ICD-10-CM

## 2023-03-30 DIAGNOSIS — O99213 Obesity complicating pregnancy, third trimester: Secondary | ICD-10-CM | POA: Diagnosis not present

## 2023-03-30 DIAGNOSIS — O09523 Supervision of elderly multigravida, third trimester: Secondary | ICD-10-CM

## 2023-04-05 ENCOUNTER — Telehealth: Payer: Self-pay

## 2023-04-05 NOTE — Telephone Encounter (Signed)
 Left message for patient to call me back need to switch to NST - due to the need for 5 UA Doppler ultrasound slots

## 2023-04-06 ENCOUNTER — Ambulatory Visit: Payer: Medicaid Other | Attending: Obstetrics and Gynecology | Admitting: Obstetrics and Gynecology

## 2023-04-06 ENCOUNTER — Other Ambulatory Visit: Payer: Self-pay | Admitting: *Deleted

## 2023-04-06 ENCOUNTER — Ambulatory Visit: Payer: Medicaid Other | Attending: Obstetrics

## 2023-04-06 DIAGNOSIS — O3413 Maternal care for benign tumor of corpus uteri, third trimester: Secondary | ICD-10-CM

## 2023-04-06 DIAGNOSIS — E669 Obesity, unspecified: Secondary | ICD-10-CM

## 2023-04-06 DIAGNOSIS — O403XX0 Polyhydramnios, third trimester, not applicable or unspecified: Secondary | ICD-10-CM

## 2023-04-06 DIAGNOSIS — O09523 Supervision of elderly multigravida, third trimester: Secondary | ICD-10-CM | POA: Diagnosis not present

## 2023-04-06 DIAGNOSIS — O10913 Unspecified pre-existing hypertension complicating pregnancy, third trimester: Secondary | ICD-10-CM | POA: Diagnosis not present

## 2023-04-06 DIAGNOSIS — O09293 Supervision of pregnancy with other poor reproductive or obstetric history, third trimester: Secondary | ICD-10-CM

## 2023-04-06 DIAGNOSIS — O10919 Unspecified pre-existing hypertension complicating pregnancy, unspecified trimester: Secondary | ICD-10-CM | POA: Diagnosis not present

## 2023-04-06 DIAGNOSIS — O09529 Supervision of elderly multigravida, unspecified trimester: Secondary | ICD-10-CM | POA: Insufficient documentation

## 2023-04-06 DIAGNOSIS — D259 Leiomyoma of uterus, unspecified: Secondary | ICD-10-CM | POA: Diagnosis not present

## 2023-04-06 DIAGNOSIS — Z3A33 33 weeks gestation of pregnancy: Secondary | ICD-10-CM

## 2023-04-06 DIAGNOSIS — O10013 Pre-existing essential hypertension complicating pregnancy, third trimester: Secondary | ICD-10-CM | POA: Diagnosis not present

## 2023-04-06 DIAGNOSIS — O99213 Obesity complicating pregnancy, third trimester: Secondary | ICD-10-CM | POA: Diagnosis not present

## 2023-04-06 NOTE — Progress Notes (Signed)
 Maternal-Fetal Medicine Consultation I had the pleasure of seeing [name] [facility]. She is G8 P4 and is here for antenatal testing. She has chronic hypertension and takes nifedipine  XL 30 mg daily.  Blood pressure today at our office is 130/79 mmHg. She does not have gestational diabetes (1-hour GCT). Obstetric history significant for 4 term vaginal deliveries.  Ultrasound Mild polyhydramnios is seen (AFI 26 cm).  Fetal stomach and kidneys appear normal.  Good fetal activity is present.  Cephalic presentation.  Antenatal testing is reassuring.  BPP 8/8.  Polyhydramnios I counseled the patient that in the absence of gestational diabetes, polyhydramnios is usually idiopathic (no known cause and is associated with good pregnancy outcomes.  However, polyhydramnios can be associated with fetal anomalies that may be evident only after birth.  I reassured the patient of normal blood pressure readings and encouraged her to continue nifedipine . Discussed timing of delivery.  If blood pressures are well-controlled, delivery can be considered at 38 to [redacted] weeks gestation.  Recommendations -Continue weekly BPP till delivery.  Thank you for consultation.  If you have any questions or concerns, please contact me the [facility].  Consultation including face-to-face (more than 50%) counseling 20 minutes.

## 2023-04-09 ENCOUNTER — Encounter: Payer: Medicaid Other | Admitting: Advanced Practice Midwife

## 2023-04-11 ENCOUNTER — Encounter: Payer: Self-pay | Admitting: *Deleted

## 2023-04-13 ENCOUNTER — Ambulatory Visit: Payer: Medicaid Other | Attending: Obstetrics

## 2023-04-13 DIAGNOSIS — O3413 Maternal care for benign tumor of corpus uteri, third trimester: Secondary | ICD-10-CM

## 2023-04-13 DIAGNOSIS — D259 Leiomyoma of uterus, unspecified: Secondary | ICD-10-CM | POA: Diagnosis not present

## 2023-04-13 DIAGNOSIS — O09523 Supervision of elderly multigravida, third trimester: Secondary | ICD-10-CM | POA: Diagnosis not present

## 2023-04-13 DIAGNOSIS — O99213 Obesity complicating pregnancy, third trimester: Secondary | ICD-10-CM | POA: Diagnosis not present

## 2023-04-13 DIAGNOSIS — O10013 Pre-existing essential hypertension complicating pregnancy, third trimester: Secondary | ICD-10-CM | POA: Diagnosis not present

## 2023-04-13 DIAGNOSIS — O10919 Unspecified pre-existing hypertension complicating pregnancy, unspecified trimester: Secondary | ICD-10-CM | POA: Diagnosis not present

## 2023-04-13 DIAGNOSIS — O09529 Supervision of elderly multigravida, unspecified trimester: Secondary | ICD-10-CM | POA: Insufficient documentation

## 2023-04-13 DIAGNOSIS — O403XX0 Polyhydramnios, third trimester, not applicable or unspecified: Secondary | ICD-10-CM | POA: Diagnosis not present

## 2023-04-13 DIAGNOSIS — O09293 Supervision of pregnancy with other poor reproductive or obstetric history, third trimester: Secondary | ICD-10-CM | POA: Diagnosis not present

## 2023-04-13 DIAGNOSIS — Z3A34 34 weeks gestation of pregnancy: Secondary | ICD-10-CM

## 2023-04-13 DIAGNOSIS — E669 Obesity, unspecified: Secondary | ICD-10-CM

## 2023-04-17 ENCOUNTER — Other Ambulatory Visit (HOSPITAL_COMMUNITY)
Admission: RE | Admit: 2023-04-17 | Discharge: 2023-04-17 | Disposition: A | Discharge status: Home / Self Care | Payer: Medicaid Other | Source: Ambulatory Visit | Attending: Obstetrics & Gynecology | Admitting: Obstetrics & Gynecology

## 2023-04-17 ENCOUNTER — Ambulatory Visit: Payer: Medicaid Other | Admitting: Obstetrics & Gynecology

## 2023-04-17 VITALS — BP 131/86 | HR 90 | Wt 207.4 lb

## 2023-04-17 DIAGNOSIS — O403XX0 Polyhydramnios, third trimester, not applicable or unspecified: Secondary | ICD-10-CM | POA: Diagnosis not present

## 2023-04-17 DIAGNOSIS — O0990 Supervision of high risk pregnancy, unspecified, unspecified trimester: Secondary | ICD-10-CM

## 2023-04-17 DIAGNOSIS — Z3A35 35 weeks gestation of pregnancy: Secondary | ICD-10-CM | POA: Insufficient documentation

## 2023-04-17 DIAGNOSIS — O09523 Supervision of elderly multigravida, third trimester: Secondary | ICD-10-CM | POA: Diagnosis not present

## 2023-04-17 DIAGNOSIS — O10919 Unspecified pre-existing hypertension complicating pregnancy, unspecified trimester: Secondary | ICD-10-CM

## 2023-04-17 DIAGNOSIS — O99210 Obesity complicating pregnancy, unspecified trimester: Secondary | ICD-10-CM

## 2023-04-17 DIAGNOSIS — O2600 Excessive weight gain in pregnancy, unspecified trimester: Secondary | ICD-10-CM | POA: Diagnosis not present

## 2023-04-17 NOTE — Progress Notes (Signed)
   PRENATAL VISIT NOTE  Subjective:  Erin Johnston is a 38 y.o. [id number] at [redacted]w[redacted]d being seen today for ongoing prenatal care.  She is currently monitored for the following issues for this [place]-risk pregnancy and has Hypertension; Supervision of [place] risk pregnancy, antepartum; History of gestational diabetes in prior pregnancy, currently pregnant; Chronic hypertension affecting pregnancy; Multigravida of advanced maternal age; Obesity affecting pregnancy, antepartum; Uterine fibroid complicating antenatal care, baby not yet delivered; and Polyhydramnios in third trimester on their problem list.  Patient reports no complaints.  Contractions: Not present. Vag. Bleeding: None.  Movement: Present. Denies leaking of fluid.   The following portions of the patient's history were reviewed and updated as appropriate: allergies, current medications, past family history, past medical history, past social history, past surgical history and problem list.   Objective:   Vitals:   04/17/23 1526  BP: (!) 144/94  Pulse: 99  Weight: 207 lb 6.4 oz (94.1 kg)    Fetal Status:     Movement: Present     General:  Alert, oriented and cooperative. Patient is in no acute distress.  Skin: Skin is warm and dry. No rash noted.   Cardiovascular: Normal heart rate noted  Respiratory: Normal respiratory effort, no problems with respiration noted  Abdomen: Soft, gravid, appropriate for gestational age.  Pain/Pressure: Absent     Pelvic: Cervical exam performed in the presence of a chaperone       closed/thick/[place]  Extremities: Normal range of motion.  STR- 3 + bilaterally (She says that this is her normal). 1+ pedal edema  Mental Status: Normal mood and affect. Normal behavior. Normal judgment and thought content.   Assessment and Plan:  Pregnancy: [id number] at 106w1d 1. Chronic hypertension affecting pregnancy (Primary) - She did not take Procardia today - weekly MFM BPP - she is aware that she will not go to [redacted]  weeks EGA - She will take procardia as soon as she gets home - She has no signs or symptoms of pre eclampsia and we discussed the warning signs  2. Supervision of Mel[place] risk pregnancy, antepartum  - Culture, beta strep (group b only) - Cervicovaginal ancillary only( [place])  3. Multigravida of advanced maternal age in third trimester - She plans to [facility]  4. Obesity affecting pregnancy, antepartum, unspecified obesity type  5. Polyhydramnios in third trimester complication, single or unspecified fetus  6. [redacted] weeks gestation of pregnancy  - Culture, beta strep (group b only) - Cervicovaginal ancillary only( [place])  Preterm labor symptoms and general obstetric precautions including but not limited to vaginal bleeding, contractions, leaking of fluid and fetal movement were reviewed in detail with the patient. Please refer to After Visit Summary for other counseling recommendations.   Return in about 1 week (around 04/24/2023).  Future Appointments  Date Time Provider Department Center  04/19/2023  3:15 PM [facility] NURSE [facility]  04/19/2023  3:30 PM [facility] US4 [facility]  04/26/2023  2:15 PM [facility] NURSE [facility]  04/26/2023  2:30 PM [facility] US6 [facility]    Allie Bossier, MD

## 2023-04-17 NOTE — Progress Notes (Signed)
 Pt. Presents for rob. Pt has no questions or concerns at this time.

## 2023-04-18 LAB — CERVICOVAGINAL ANCILLARY ONLY
Chlamydia: NEGATIVE
Comment: NEGATIVE
Comment: NORMAL
Neisseria Gonorrhea: NEGATIVE

## 2023-04-19 ENCOUNTER — Ambulatory Visit: Payer: Medicaid Other | Attending: Obstetrics and Gynecology

## 2023-04-19 ENCOUNTER — Ambulatory Visit: Payer: Medicaid Other

## 2023-04-21 LAB — CULTURE, BETA STREP (GROUP B ONLY): Strep Gp B Culture: NEGATIVE

## 2023-04-26 ENCOUNTER — Ambulatory Visit (HOSPITAL_BASED_OUTPATIENT_CLINIC_OR_DEPARTMENT_OTHER): Payer: Medicaid Other

## 2023-04-26 ENCOUNTER — Ambulatory Visit: Payer: Medicaid Other | Attending: Obstetrics and Gynecology

## 2023-04-26 VITALS — BP 125/77 | HR 89

## 2023-04-26 DIAGNOSIS — O99213 Obesity complicating pregnancy, third trimester: Secondary | ICD-10-CM

## 2023-04-26 DIAGNOSIS — O3413 Maternal care for benign tumor of corpus uteri, third trimester: Secondary | ICD-10-CM | POA: Insufficient documentation

## 2023-04-26 DIAGNOSIS — O09293 Supervision of pregnancy with other poor reproductive or obstetric history, third trimester: Secondary | ICD-10-CM

## 2023-04-26 DIAGNOSIS — O10013 Pre-existing essential hypertension complicating pregnancy, third trimester: Secondary | ICD-10-CM

## 2023-04-26 DIAGNOSIS — O09523 Supervision of elderly multigravida, third trimester: Secondary | ICD-10-CM

## 2023-04-26 DIAGNOSIS — Z3A36 36 weeks gestation of pregnancy: Secondary | ICD-10-CM

## 2023-04-26 DIAGNOSIS — O09299 Supervision of pregnancy with other poor reproductive or obstetric history, unspecified trimester: Secondary | ICD-10-CM

## 2023-04-26 DIAGNOSIS — O403XX0 Polyhydramnios, third trimester, not applicable or unspecified: Secondary | ICD-10-CM | POA: Insufficient documentation

## 2023-04-26 DIAGNOSIS — E669 Obesity, unspecified: Secondary | ICD-10-CM

## 2023-04-26 DIAGNOSIS — O10919 Unspecified pre-existing hypertension complicating pregnancy, unspecified trimester: Secondary | ICD-10-CM

## 2023-04-26 DIAGNOSIS — D259 Leiomyoma of uterus, unspecified: Secondary | ICD-10-CM

## 2023-04-26 DIAGNOSIS — O99210 Obesity complicating pregnancy, unspecified trimester: Secondary | ICD-10-CM

## 2023-04-26 DIAGNOSIS — O0990 Supervision of high risk pregnancy, unspecified, unspecified trimester: Secondary | ICD-10-CM

## 2023-04-27 ENCOUNTER — Other Ambulatory Visit: Payer: Self-pay

## 2023-04-27 DIAGNOSIS — O09523 Supervision of elderly multigravida, third trimester: Secondary | ICD-10-CM

## 2023-04-27 DIAGNOSIS — O10919 Unspecified pre-existing hypertension complicating pregnancy, unspecified trimester: Secondary | ICD-10-CM

## 2023-05-07 ENCOUNTER — Other Ambulatory Visit: Payer: Self-pay

## 2023-05-07 ENCOUNTER — Telehealth (HOSPITAL_COMMUNITY): Payer: Self-pay | Admitting: *Deleted

## 2023-05-07 ENCOUNTER — Ambulatory Visit: Attending: Obstetrics

## 2023-05-07 ENCOUNTER — Ambulatory Visit: Attending: Obstetrics and Gynecology | Admitting: Obstetrics and Gynecology

## 2023-05-07 ENCOUNTER — Encounter (HOSPITAL_COMMUNITY): Payer: Self-pay | Admitting: *Deleted

## 2023-05-07 DIAGNOSIS — D259 Leiomyoma of uterus, unspecified: Secondary | ICD-10-CM | POA: Diagnosis not present

## 2023-05-07 DIAGNOSIS — O09293 Supervision of pregnancy with other poor reproductive or obstetric history, third trimester: Secondary | ICD-10-CM

## 2023-05-07 DIAGNOSIS — O09523 Supervision of elderly multigravida, third trimester: Secondary | ICD-10-CM | POA: Diagnosis not present

## 2023-05-07 DIAGNOSIS — O10013 Pre-existing essential hypertension complicating pregnancy, third trimester: Secondary | ICD-10-CM

## 2023-05-07 DIAGNOSIS — E669 Obesity, unspecified: Secondary | ICD-10-CM | POA: Diagnosis not present

## 2023-05-07 DIAGNOSIS — Z3A38 38 weeks gestation of pregnancy: Secondary | ICD-10-CM

## 2023-05-07 DIAGNOSIS — O10919 Unspecified pre-existing hypertension complicating pregnancy, unspecified trimester: Secondary | ICD-10-CM | POA: Diagnosis not present

## 2023-05-07 DIAGNOSIS — O3413 Maternal care for benign tumor of corpus uteri, third trimester: Secondary | ICD-10-CM

## 2023-05-07 DIAGNOSIS — O99213 Obesity complicating pregnancy, third trimester: Secondary | ICD-10-CM | POA: Diagnosis not present

## 2023-05-07 DIAGNOSIS — E6689 Other obesity not elsewhere classified: Secondary | ICD-10-CM | POA: Diagnosis not present

## 2023-05-07 NOTE — Progress Notes (Signed)
 Maternal-Fetal Medicine Consultation I had the pleasure of seeing Ms. today at the [facility]. She is G8 P4 at [employer]' gestation and is here for antenatal testing (BPP. She has chronic hypertension and her blood pressures are well-controlled with nifedipine XL 30 mg daily.  Blood pressure today at our office is 145/77 mmHg.  She does not have signs and symptoms of severe features of preeclampsia. Obstetrical history is significant for 4 term vaginal deliveries.  Ultrasound Amniotic fluid is normal good fetal activity seen.  Cephalic presentation.  Antenatal testing is reassuring.  BPP 8/8.  I reassured the patient of the findings.  Patient had missed her prenatal visit appointment.  I discussed timing of delivery.  And patient with chronic hypertension on antihypertensives, we recommend delivery at [redacted] weeks gestation provided her blood pressures are well-controlled.  Superimposed preeclampsia complication chronic hypertension and 50% of the cases if delivery is delayed beyond [redacted] weeks gestation. Patient agreed with my recommendations.  Patient is scheduled to undergo induction of labor on 05/13/2023 by Dr. Jolayne Panther.  Patient will get a call from our scheduler.  Consultation including face-to-face (more than 50%) counseling 10 minutes.

## 2023-05-07 NOTE — Telephone Encounter (Signed)
 Preadmission screen

## 2023-05-10 ENCOUNTER — Ambulatory Visit (INDEPENDENT_AMBULATORY_CARE_PROVIDER_SITE_OTHER): Admitting: Obstetrics & Gynecology

## 2023-05-10 ENCOUNTER — Encounter: Payer: Self-pay | Admitting: Obstetrics & Gynecology

## 2023-05-10 VITALS — BP 132/84 | HR 94 | Wt 212.0 lb

## 2023-05-10 DIAGNOSIS — Z3A38 38 weeks gestation of pregnancy: Secondary | ICD-10-CM | POA: Diagnosis not present

## 2023-05-10 DIAGNOSIS — Z8632 Personal history of gestational diabetes: Secondary | ICD-10-CM

## 2023-05-10 DIAGNOSIS — O09299 Supervision of pregnancy with other poor reproductive or obstetric history, unspecified trimester: Secondary | ICD-10-CM | POA: Diagnosis not present

## 2023-05-10 DIAGNOSIS — O0990 Supervision of high risk pregnancy, unspecified, unspecified trimester: Secondary | ICD-10-CM | POA: Diagnosis not present

## 2023-05-10 NOTE — Progress Notes (Signed)
 Pt is scheduled for IOL on Sunday.

## 2023-05-10 NOTE — Progress Notes (Signed)
   PRENATAL VISIT NOTE  Subjective:  Erin Johnston is a 38 y.o. [id number] at [redacted]w[redacted]d being seen today for ongoing prenatal care.  She is currently monitored for the following issues for this [place]-risk pregnancy and has Hypertension; Supervision of [place] risk pregnancy, antepartum; History of gestational diabetes in prior pregnancy, currently pregnant; Chronic hypertension affecting pregnancy; Multigravida of advanced maternal age; Obesity affecting pregnancy, antepartum; Uterine fibroid complicating antenatal care, baby not yet delivered; Polyhydramnios in third trimester; and Excessive weight gain affecting pregnancy on their problem list.  Patient reports occasional contractions.  Contractions: Not present. Vag. Bleeding: None.  Movement: Present. Denies leaking of fluid.   The following portions of the patient's history were reviewed and updated as appropriate: allergies, current medications, past family history, past medical history, past social history, past surgical history and problem list.   Objective:   Vitals:   05/10/23 1535  BP: 132/84  Pulse: 94  Weight: 212 lb (96.2 kg)    Fetal Status: Fetal Heart Rate (bpm): 150   Movement: Present  Presentation: Vertex  General:  Alert, oriented and cooperative. Patient is in no acute distress.  Skin: Skin is warm and dry. No rash noted.   Cardiovascular: Normal heart rate noted  Respiratory: Normal respiratory effort, no problems with respiration noted  Abdomen: Soft, gravid, appropriate for gestational age.  Pain/Pressure: Absent     Pelvic: Cervical exam performed in the presence of a chaperone Dilation: 1 Effacement (%): 50 Station: Ballotable  Extremities: Normal range of motion.     Mental Status: Normal mood and affect. Normal behavior. Normal judgment and thought content.   Assessment and Plan:  Pregnancy: [id number] at [redacted]w[redacted]d 1. Supervision of [place] risk pregnancy, antepartum (Primary) IOL 39 weeks  2. History of gestational  diabetes in prior pregnancy, currently pregnant   3. [redacted] weeks gestation of pregnancy   Term labor symptoms and general obstetric precautions including but not limited to vaginal bleeding, contractions, leaking of fluid and fetal movement were reviewed in detail with the patient. Please refer to After Visit Summary for other counseling recommendations.   Return if symptoms worsen or fail to improve.  Future Appointments  Date Time Provider Department Center  05/14/2023 12:00 AM [facility] SCHED ROOM [facility] None    Scheryl Darter, MD

## 2023-05-13 ENCOUNTER — Other Ambulatory Visit (HOSPITAL_COMMUNITY): Payer: Self-pay | Admitting: Emergency Medicine

## 2023-05-14 ENCOUNTER — Encounter (HOSPITAL_COMMUNITY): Payer: Self-pay | Admitting: Obstetrics & Gynecology

## 2023-05-14 ENCOUNTER — Inpatient Hospital Stay (HOSPITAL_COMMUNITY)

## 2023-05-14 ENCOUNTER — Inpatient Hospital Stay (HOSPITAL_COMMUNITY)
Admission: AD | Admit: 2023-05-14 | Discharge: 2023-05-16 | DRG: 807 | Disposition: A | Discharge status: Home / Self Care | Attending: Obstetrics & Gynecology | Admitting: Obstetrics & Gynecology

## 2023-05-14 ENCOUNTER — Other Ambulatory Visit: Payer: Self-pay

## 2023-05-14 ENCOUNTER — Inpatient Hospital Stay (HOSPITAL_COMMUNITY): Admitting: Anesthesiology

## 2023-05-14 DIAGNOSIS — O99214 Obesity complicating childbirth: Secondary | ICD-10-CM | POA: Diagnosis not present

## 2023-05-14 DIAGNOSIS — O09523 Supervision of elderly multigravida, third trimester: Secondary | ICD-10-CM | POA: Diagnosis not present

## 2023-05-14 DIAGNOSIS — Z8632 Personal history of gestational diabetes: Secondary | ICD-10-CM | POA: Diagnosis not present

## 2023-05-14 DIAGNOSIS — E66813 Obesity, class 3: Secondary | ICD-10-CM | POA: Diagnosis present

## 2023-05-14 DIAGNOSIS — Z30017 Encounter for initial prescription of implantable subdermal contraceptive: Secondary | ICD-10-CM

## 2023-05-14 DIAGNOSIS — O0990 Supervision of high risk pregnancy, unspecified, unspecified trimester: Secondary | ICD-10-CM

## 2023-05-14 DIAGNOSIS — O3413 Maternal care for benign tumor of corpus uteri, third trimester: Secondary | ICD-10-CM | POA: Diagnosis not present

## 2023-05-14 DIAGNOSIS — Z3A39 39 weeks gestation of pregnancy: Secondary | ICD-10-CM | POA: Diagnosis not present

## 2023-05-14 DIAGNOSIS — K219 Gastro-esophageal reflux disease without esophagitis: Secondary | ICD-10-CM | POA: Diagnosis present

## 2023-05-14 DIAGNOSIS — O9962 Diseases of the digestive system complicating childbirth: Secondary | ICD-10-CM | POA: Diagnosis present

## 2023-05-14 DIAGNOSIS — D259 Leiomyoma of uterus, unspecified: Secondary | ICD-10-CM | POA: Diagnosis not present

## 2023-05-14 DIAGNOSIS — Z87891 Personal history of nicotine dependence: Secondary | ICD-10-CM

## 2023-05-14 DIAGNOSIS — Z8249 Family history of ischemic heart disease and other diseases of the circulatory system: Secondary | ICD-10-CM | POA: Diagnosis not present

## 2023-05-14 DIAGNOSIS — O1092 Unspecified pre-existing hypertension complicating childbirth: Principal | ICD-10-CM | POA: Diagnosis present

## 2023-05-14 DIAGNOSIS — O1002 Pre-existing essential hypertension complicating childbirth: Secondary | ICD-10-CM | POA: Diagnosis not present

## 2023-05-14 DIAGNOSIS — Z833 Family history of diabetes mellitus: Secondary | ICD-10-CM | POA: Diagnosis not present

## 2023-05-14 DIAGNOSIS — O10919 Unspecified pre-existing hypertension complicating pregnancy, unspecified trimester: Principal | ICD-10-CM | POA: Diagnosis present

## 2023-05-14 DIAGNOSIS — Z975 Presence of (intrauterine) contraceptive device: Secondary | ICD-10-CM

## 2023-05-14 DIAGNOSIS — O403XX0 Polyhydramnios, third trimester, not applicable or unspecified: Secondary | ICD-10-CM | POA: Diagnosis not present

## 2023-05-14 DIAGNOSIS — O10419 Pre-existing secondary hypertension complicating pregnancy, unspecified trimester: Secondary | ICD-10-CM

## 2023-05-14 LAB — CBC
HCT: 38.4 % (ref 36.0–46.0)
HCT: 39.5 % (ref 36.0–46.0)
Hemoglobin: 12.7 g/dL (ref 12.0–15.0)
Hemoglobin: 13.2 g/dL (ref 12.0–15.0)
MCH: 28.8 pg (ref 26.0–34.0)
MCH: 28.9 pg (ref 26.0–34.0)
MCHC: 33.1 g/dL (ref 30.0–36.0)
MCHC: 33.4 g/dL (ref 30.0–36.0)
MCV: 86.2 fL (ref 80.0–100.0)
MCV: 87.3 fL (ref 80.0–100.0)
Platelets: 191 10*3/uL (ref 150–400)
Platelets: 216 10*3/uL (ref 150–400)
RBC: 4.4 MIL/uL (ref 3.87–5.11)
RBC: 4.58 MIL/uL (ref 3.87–5.11)
RDW: 15.2 % (ref 11.5–15.5)
RDW: 15.4 % (ref 11.5–15.5)
WBC: 5.4 10*3/uL (ref 4.0–10.5)
WBC: 6.8 10*3/uL (ref 4.0–10.5)
nRBC: 0 % (ref 0.0–0.2)
nRBC: 0 % (ref 0.0–0.2)

## 2023-05-14 LAB — COMPREHENSIVE METABOLIC PANEL
ALT: 33 U/L (ref 0–44)
AST: 59 U/L — ABNORMAL HIGH (ref 15–41)
Albumin: 2.5 g/dL — ABNORMAL LOW (ref 3.5–5.0)
Alkaline Phosphatase: 220 U/L — ABNORMAL HIGH (ref 38–126)
Anion gap: 9 (ref 5–15)
BUN: 7 mg/dL (ref 6–20)
CO2: 24 mmol/L (ref 22–32)
Calcium: 8.8 mg/dL — ABNORMAL LOW (ref 8.9–10.3)
Chloride: 107 mmol/L (ref 98–111)
Creatinine, Ser: 0.68 mg/dL (ref 0.44–1.00)
GFR, Estimated: >60 mL/min (ref >60)
Glucose, Bld: 105 mg/dL — ABNORMAL HIGH (ref 70–99)
Potassium: 3.5 mmol/L (ref 3.5–5.1)
Sodium: 140 mmol/L (ref 135–145)
Total Bilirubin: 0.7 mg/dL (ref 0.0–1.2)
Total Protein: 6.2 g/dL — ABNORMAL LOW (ref 6.5–8.1)

## 2023-05-14 LAB — TYPE AND SCREEN
ABO/RH(D): O POS
Antibody Screen: NEGATIVE

## 2023-05-14 LAB — PROTEIN / CREATININE RATIO, URINE
Creatinine, Urine: 103 mg/dL
Protein Creatinine Ratio: 0.12 mg/mg{creat} (ref 0.00–0.15)
Total Protein, Urine: 12 mg/dL

## 2023-05-14 LAB — RPR: RPR Ser Ql: NONREACTIVE

## 2023-05-14 MED ORDER — LIDOCAINE HCL (PF) 1 % IJ SOLN: 30.0000 mL | INTRAMUSCULAR | Status: DC | PRN

## 2023-05-14 MED ORDER — LACTATED RINGERS IV SOLN: INTRAVENOUS | Status: DC

## 2023-05-14 MED ORDER — NIFEDIPINE ER OSMOTIC RELEASE 30 MG PO TB24
30.0000 mg | ORAL_TABLET | Freq: Every day | ORAL | Status: DC
  Administered 2023-05-14 – 2023-05-16 (×3): 30 mg via ORAL
  Filled 2023-05-14 (×4): qty 1

## 2023-05-14 MED ORDER — TRANEXAMIC ACID-NACL 1000-0.7 MG/100ML-% IV SOLN
1000.0000 mg | INTRAVENOUS | Status: AC
  Administered 2023-05-15: 1000 mg via INTRAVENOUS
  Filled 2023-05-14: qty 100

## 2023-05-14 MED ORDER — HYDRALAZINE HCL 20 MG/ML IJ SOLN: 10.0000 mg | INTRAMUSCULAR | Status: DC | PRN

## 2023-05-14 MED ORDER — MISOPROSTOL 25 MCG QUARTER TABLET
25.0000 ug | ORAL_TABLET | Freq: Once | ORAL | Status: AC
  Administered 2023-05-14: 25 ug via VAGINAL
  Filled 2023-05-14: qty 1

## 2023-05-14 MED ORDER — ONDANSETRON HCL 4 MG/2ML IJ SOLN: 4.0000 mg | Freq: Four times a day (QID) | INTRAMUSCULAR | Status: DC | PRN

## 2023-05-14 MED ORDER — LABETALOL HCL 5 MG/ML IV SOLN: 40.0000 mg | INTRAVENOUS | Status: DC | PRN

## 2023-05-14 MED ORDER — DIPHENHYDRAMINE HCL 50 MG/ML IJ SOLN
12.5000 mg | INTRAMUSCULAR | Status: DC | PRN
  Administered 2023-05-14: 12.5 mg via INTRAVENOUS
  Filled 2023-05-14: qty 1

## 2023-05-14 MED ORDER — LACTATED RINGERS IV SOLN
500.0000 mL | Freq: Once | INTRAVENOUS | Status: AC
  Administered 2023-05-14: 500 mL via INTRAVENOUS

## 2023-05-14 MED ORDER — FENTANYL CITRATE (PF) 100 MCG/2ML IJ SOLN
50.0000 ug | INTRAMUSCULAR | Status: DC | PRN
  Administered 2023-05-14 (×2): 100 ug via INTRAVENOUS
  Filled 2023-05-14 (×2): qty 2

## 2023-05-14 MED ORDER — OXYCODONE-ACETAMINOPHEN 5-325 MG PO TABS: 1.0000 | ORAL_TABLET | ORAL | Status: DC | PRN

## 2023-05-14 MED ORDER — TERBUTALINE SULFATE 1 MG/ML IJ SOLN: 0.2500 mg | Freq: Once | INTRAMUSCULAR | Status: DC | PRN

## 2023-05-14 MED ORDER — LACTATED RINGERS AMNIOINFUSION: INTRAVENOUS | Status: DC

## 2023-05-14 MED ORDER — EPHEDRINE 5 MG/ML INJ: 10.0000 mg | INTRAVENOUS | Status: DC | PRN

## 2023-05-14 MED ORDER — LACTATED RINGERS IV SOLN: 500.0000 mL | INTRAVENOUS | Status: DC | PRN

## 2023-05-14 MED ORDER — PHENYLEPHRINE 80 MCG/ML (10ML) SYRINGE FOR IV PUSH (FOR BLOOD PRESSURE SUPPORT): 80.0000 ug | PREFILLED_SYRINGE | INTRAVENOUS | Status: DC | PRN

## 2023-05-14 MED ORDER — OXYTOCIN BOLUS FROM INFUSION
333.0000 mL | Freq: Once | INTRAVENOUS | Status: AC
  Administered 2023-05-15: 333 mL via INTRAVENOUS

## 2023-05-14 MED ORDER — LABETALOL HCL 5 MG/ML IV SOLN: 80.0000 mg | INTRAVENOUS | Status: DC | PRN

## 2023-05-14 MED ORDER — MISOPROSTOL 50MCG HALF TABLET
50.0000 ug | ORAL_TABLET | Freq: Once | ORAL | Status: AC
  Administered 2023-05-14: 50 ug via ORAL
  Filled 2023-05-14: qty 1

## 2023-05-14 MED ORDER — FENTANYL-BUPIVACAINE-NACL 0.5-0.125-0.9 MG/250ML-% EP SOLN
12.0000 mL/h | EPIDURAL | Status: DC | PRN
  Administered 2023-05-14: 12 mL/h via EPIDURAL
  Filled 2023-05-14: qty 250

## 2023-05-14 MED ORDER — ACETAMINOPHEN 325 MG PO TABS
650.0000 mg | ORAL_TABLET | ORAL | Status: DC | PRN
  Administered 2023-05-15: 650 mg via ORAL
  Filled 2023-05-14: qty 2

## 2023-05-14 MED ORDER — LABETALOL HCL 5 MG/ML IV SOLN: 20.0000 mg | INTRAVENOUS | Status: DC | PRN

## 2023-05-14 MED ORDER — SOD CITRATE-CITRIC ACID 500-334 MG/5ML PO SOLN: 30.0000 mL | ORAL | Status: DC | PRN

## 2023-05-14 MED ORDER — LIDOCAINE HCL (PF) 1 % IJ SOLN
INTRAMUSCULAR | Status: DC | PRN
  Administered 2023-05-14 (×2): 4 mL via EPIDURAL

## 2023-05-14 MED ORDER — OXYTOCIN-SODIUM CHLORIDE 30-0.9 UT/500ML-% IV SOLN
1.0000 m[IU]/min | INTRAVENOUS | Status: DC
  Administered 2023-05-14: 2 m[IU]/min via INTRAVENOUS
  Filled 2023-05-14: qty 500

## 2023-05-14 MED ORDER — OXYCODONE-ACETAMINOPHEN 5-325 MG PO TABS: 2.0000 | ORAL_TABLET | ORAL | Status: DC | PRN

## 2023-05-14 MED ORDER — OXYTOCIN-SODIUM CHLORIDE 30-0.9 UT/500ML-% IV SOLN: 2.5000 [IU]/h | INTRAVENOUS | Status: DC

## 2023-05-14 NOTE — Progress Notes (Signed)
 Labor Progress Note  Kemyah Buser Medlen is a 38 y.o. [id number] at [redacted]w[redacted]d presented for IOL CHTN   S: per nursing FB out at close to 0800 this morning.  Pt agreeable to check to see if AROM safe option at this time.  O:  BP 120/78 (BP Location: Left Arm)   Pulse 84   Temp 97.6 F (36.4 C) (Oral)   Resp 18   Ht 5' (1.524 m)   Wt 94.8 kg   LMP 08/14/2022 (Exact Date)   BMI 40.82 kg/m  EFM:130bpm/Moderate variability/ 15x15 accels/ None decels CAT: 1 Toco: regular, every 3 minutes   CVE: Dilation: 4 Effacement (%): 50 Station: -3 Presentation: Vertex Exam by:: Dyke Brackett, RN   A&P: 38 y.o. [phone] [redacted]w[redacted]d  here for IOL as above  #Labor: Pt not tolerating exam, doubt patient is truly 4cm at internal os but stretchy 3.5 at external os.  Pt would prefer to continue walking with pitocin to have baby descend prior to IV pain medication and repeat exam in a few hours #Pain: per patient request #FWB: CAT 1 #GBS negative  #cHTN: UPC 0.12 // 216 // 59/33, Cr 0.68 (~baseline), mild to moderate range Bps here, pt asymptomatic  - ordered home Procardia  - CTM Bps  - labetalol PRN order placed   #AMA #Hx of GDM in prior preg #Polyhydramnios, resolved: @[redacted]w[redacted]d  2966g, 56% EFW, AFI normal, BPP 8/8  #Uterine fibroid  Hessie Dibble, MD FMOB Fellow, Faculty practice [facility], Center for [facility] 05/14/23  10:47 AM

## 2023-05-14 NOTE — Anesthesia Preprocedure Evaluation (Signed)
 Anesthesia Evaluation  Patient identified by MRN, date of birth, ID band Patient awake    Reviewed: Allergy & Precautions, Patient's Chart, lab work & pertinent test results  Airway Mallampati: II       Dental no notable dental hx.    Pulmonary neg pulmonary ROS, former smoker   Pulmonary exam normal        Cardiovascular hypertension, Pt. on medications Normal cardiovascular exam Rhythm:Regular Rate:Normal     Neuro/Psych negative neurological ROS  negative psych ROS   GI/Hepatic Neg liver ROS,GERD  ,,  Endo/Other    Class 3 obesity  Renal/GU negative Renal ROS  negative genitourinary   Musculoskeletal negative musculoskeletal ROS (+)    Abdominal  (+) + obese  Peds  Hematology negative hematology ROS (+)   Anesthesia Other Findings   Reproductive/Obstetrics (+) Pregnancy AMA Gestational HTN                              Anesthesia Physical Anesthesia Plan  ASA: 3  Anesthesia Plan: Epidural   Post-op Pain Management:    Induction:   PONV Risk Score and Plan: Treatment may vary due to age or medical condition  Airway Management Planned: Natural Airway  Additional Equipment: Fetal Monitoring and None  Intra-op Plan:   Post-operative Plan:   Informed Consent: I have reviewed the patients History and Physical, chart, labs and discussed the procedure including the risks, benefits and alternatives for the proposed anesthesia with the patient or authorized representative who has indicated his/her understanding and acceptance.       Plan Discussed with: Anesthesiologist  Anesthesia Plan Comments:          Anesthesia Quick Evaluation

## 2023-05-14 NOTE — Progress Notes (Addendum)
 Comfortable w/epidural.  IUPC replaced.  MVUs ~ 220. Cx 7/90/-2/not swollen.  FHR Cat 1. Pitocin at 4 mu/min. Continue present mgt.

## 2023-05-14 NOTE — Progress Notes (Signed)
 Pt with prolonged decel, Dr. Adrian Blackwater placed IUPC and started AI, Pit stopped, peripheral bolus with return back to baseline.  Will plan to restart Pit once FHT stable.   Mittie Bodo, MD Family Medicine - Obstetrics Fellow

## 2023-05-14 NOTE — Anesthesia Procedure Notes (Signed)
 Epidural Patient location during procedure: OB Start time: 05/14/2023 12:16 PM End time: 05/14/2023 12:26 PM  Staffing Anesthesiologist: Mal Amabile, MD Performed: anesthesiologist   Preanesthetic Checklist Completed: patient identified, IV checked, site marked, risks and benefits discussed, surgical consent, monitors and equipment checked, pre-op evaluation and timeout performed  Epidural Patient position: sitting Prep: DuraPrep and site prepped and draped Patient monitoring: continuous pulse ox and blood pressure Approach: midline Location: L4-L5 Injection technique: LOR air  Needle:  Needle type: Tuohy  Needle gauge: 17 G Needle length: 9 cm and 9 Needle insertion depth: 6 cm Catheter type: closed end flexible Catheter size: 19 Gauge Catheter at skin depth: 11 cm Test dose: negative and Other  Assessment Events: blood not aspirated, no cerebrospinal fluid, injection not painful, no injection resistance, no paresthesia and negative IV test  Additional Notes Patient identified. Risks and benefits discussed including failed block, incomplete  Pain control, post dural puncture headache, nerve damage, paralysis, blood pressure Changes, nausea, vomiting, reactions to medications-both toxic and allergic and post Partum back pain. All questions were answered. Patient expressed understanding and wished to proceed. Sterile technique was used throughout procedure. Epidural site was Dressed with sterile barrier dressing. No paresthesias, signs of intravascular injection Or signs of intrathecal spread were encountered.  Patient was more comfortable after the epidural was dosed. Please see RN's note for documentation of vital signs and FHR which are stable. Reason for block:procedure for pain

## 2023-05-14 NOTE — Progress Notes (Signed)
 Told by nursing SROM'ed clear fluid.  Went to bedside pt awaiting epidural and having frequent painful contractions.  CVE 5/50/-3.  NST Cat 1 with baseline 130bpm, 10x10accels, no decels, moderate variability, regular CTX .    A/P: Plan for epidural and continue forward with pit   Mittie Bodo, MD [facility] Medicine - Obstetrics Fellow

## 2023-05-14 NOTE — Progress Notes (Signed)
 LABOR PROGRESS NOTE  Patient Name: Erin Johnston, female   DOB: [birth year], 38 y.o.  MRN: [id number]  Sleeping soundly. Spontaneous 5 min prolong decel, recovered without intervention. Discussed risks, benefits, indications for foley balloon; pt agreeable  Blood pressure (!) 145/98, pulse 80, temperature 97.9 F (36.6 C), temperature source Oral, resp. rate 19, height 5' (1.524 m), weight 94.8 kg, last menstrual period 08/14/2022.  EFM Baseline 130, accels, one prolong decel 80s - spontaneous recovery  Dilation: 1.5 Effacement (%): 50 Station: -3 Presentation: Vertex Exam by:: Dr. Leanora Cover  Progressing. FB placed, 40ml. Given prolong will continue with pitocin for now, if contractions space out >5 minutes will add pitocin with FB.   Mild range BP  Wyn Forster, MD FMOB Fellow, Faculty practice [facility], Center for [facility]

## 2023-05-14 NOTE — Progress Notes (Signed)
 Patient with recurrent variables, checked by nurse and thought to be 6.5/90/-1, in s/o of getting epidural and soft BP.  Will given fluid bolus and consider BP PRNs also.  If continues can get IUPC for AI.    Mittie Bodo, MD Family Medicine - Obstetrics Fellow

## 2023-05-14 NOTE — H&P (Signed)
 OBSTETRIC ADMISSION HISTORY AND PHYSICAL  Erin Johnston is a 38 y.o. female [phone] with IUP at [redacted]w[redacted]d by 5 week [place] presenting for IOL for CHTN on Procardia 30mg  daily. She reports +FMs, No LOF, no VB, no blurry vision, headaches or peripheral edema, and RUQ pain.  She plans on breast feeding. She request Nexplanon for birth control. She received her prenatal [facility]   Dating: By 6 week [place] --->  Estimated Date of Delivery: 05/21/23  Sono:   @[redacted]w[redacted]d , CWD, normal anatomy, cephalic presentation, posterior lie, 2966 gm 6 lb 9 oz 56 % EFW, AC 84%   Prenatal History/Complications:  - CHTN; 30mg  XL Procardia daily - Sciatica - AMA - BMI 40 - Polyhydramnios, AFI 26.23 @[redacted]w[redacted]d  - normal since  Past Medical History: Past Medical History:  Diagnosis Date   History of gestational hypertension 09/29/2015   Postpartum gestational hypertension with first pregnancy. Not with any other pregnancies.      Hypertension    after delivery first child   UTI (urinary tract infection)    Vaginal Pap smear, abnormal     Past Surgical History: Past Surgical History:  Procedure Laterality Date   CHOLECYSTECTOMY     INDUCED ABORTION     WISDOM TOOTH EXTRACTION      Obstetrical History: OB History     Gravida  8   Para  4   Term  4   Preterm      AB  3   Living  4      SAB  1   IAB  2   Ectopic  0   Multiple  0   Live Births  4           Social History Social History   Socioeconomic History   Marital status: Single    Spouse name: Not on file   Number of children: 4   Years of education: Not on file   Highest education level: Not on file  Occupational History   Not on file  Tobacco Use   Smoking status: Former    Types: Cigarettes    Start date: 2020   Smokeless tobacco: Never  Vaping Use   Vaping status: Never Used  Substance and Sexual Activity   Alcohol use: No   Drug use: No   Sexual activity: Not Currently    Birth control/protection: None  Other  Topics Concern   Not on file  Social History Narrative   Not on file   Social Drivers of Health   Financial Resource Strain: Not on file  Food Insecurity: No Food Insecurity (05/14/2023)   Hunger Vital Sign    Worried About Running Out of Food in the Last Year: Never true    Ran Out of Food in the Last Year: Never true  Transportation Needs: No Transportation Needs (05/14/2023)   PRAPARE - Administrator, Civil Service (Medical): No    Lack of Transportation (Non-Medical): No  Physical Activity: Not on file  Stress: Not on file  Social Connections: Not on file    Family History: Family History  Problem Relation Age of Onset   Diabetes Mother    Asthma Mother    Hypertension Mother    Hypertension Maternal Grandfather    Cancer Maternal Grandfather    Diabetes Paternal Grandmother    Cancer Paternal Grandmother    Heart disease Neg Hx     Allergies: Allergies  Allergen Reactions   Bee Pollen Itching  Tomato Itching    Medications Prior to Admission  Medication Sig Dispense Refill Last Dose/Taking   aspirin EC 81 MG tablet Take 1 tablet (81 mg total) by mouth daily. Take after 12 weeks for prevention of preeclampsia later in pregnancy 300 tablet 2 Past Week   NIFEdipine (PROCARDIA-XL/NIFEDICAL-XL) 30 MG 24 hr tablet Take 1 tablet (30 mg total) by mouth daily. Can increase to twice a day as needed for symptomatic contractions 30 tablet 2 05/13/2023 at  3:00 PM   Prenatal Vit-Fe Fumarate-FA (GOODSENSE PRENATAL VITAMINS) 28-0.8 MG TABS Take 1 tablet by mouth daily. 30 tablet 11 05/13/2023   fluticasone (FLONASE) 50 MCG/ACT nasal spray Place 1 spray into both nostrils daily. (Patient not taking: Reported on 11/10/2022) 47.4 mL 1    pantoprazole (PROTONIX) 40 MG tablet Take 1 tablet (40 mg total) by mouth daily. (Patient not taking: Reported on 04/26/2023) 90 tablet 3      Review of Systems   All systems reviewed and negative except as stated in HPI  Blood  pressure (!) 131/92, pulse 92, temperature 98.1 F (36.7 C), temperature source Oral, resp. rate 19, height 5' (1.524 m), weight 94.8 kg, last menstrual period 08/14/2022. General appearance: alert, cooperative, appears stated age, and no distress Lungs: clear to auscultation bilaterally Heart: regular rate and rhythm Abdomen: soft, non-tender; bowel sounds normal Pelvic: adequate, proven to 3490g Extremities: Homans sign is negative, no sign of DVT DTR's 2+ Presentation: cephalic Fetal monitoringBaseline: 145 bpm, Variability: Good {> 6 bpm), Accelerations: Reactive, and Decelerations: Absent Uterine activity irritability Dilation: 1 Effacement (%): 50 Station: -2 Exam by:: Dr. Leanora Cover   Prenatal labs: ABO, Rh: --/--/O POS (03/17 0011) Antibody: NEG (03/17 0011) Rubella: 1.38 (09/13 1147) RPR: Non Reactive (01/27 1541)  HBsAg: Negative (09/13 1147)  HIV: Non Reactive (01/27 1541)  GBS: Negative/-- (02/18 1603)    Lab Results  Component Value Date   GBS Negative 04/17/2023   GTT 94 1hr; normal Genetic screening  AFP negative Anatomy US normal female  Immunization History  Administered Date(s) Administered   MMR 10/21/2015   Tdap 12/31/2013    Prenatal Transfer Tool  Maternal Diabetes: No Genetic Screening: Normal Maternal Ultrasounds/Referrals: Normal Fetal Ultrasounds or other Referrals:  None Maternal Substance Abuse:  No Significant Maternal Medications:  None Significant Maternal Lab Results: Group B Strep negative Number of Prenatal Visits:greater than 3 verified prenatal visits Maternal Vaccinations: Declined Other Comments:  None   Results for orders placed or performed during the hospital encounter of 05/14/23 (from the past 24 hours)  Type and screen [facility]   Collection Time: 05/14/23 12:11 AM  Result Value Ref Range   ABO/RH(D) O POS    Antibody Screen NEG    Sample Expiration      05/17/2023,2359 Performed at [facility], [address], [place], [place] [address]   CBC   Collection Time: 05/14/23 12:12 AM  Result Value Ref Range   WBC 5.4 4.0 - 10.5 K/uL   RBC 4.40 3.87 - 5.11 MIL/uL   Hemoglobin 12.7 12.0 - 15.0 g/dL   HCT 60.4 54.0 - 98.1 %   MCV 87.3 80.0 - 100.0 fL   MCH 28.9 26.0 - 34.0 pg   MCHC 33.1 30.0 - 36.0 g/dL   RDW 19.1 47.8 - 29.5 %   Platelets 216 150 - 400 K/uL   nRBC 0.0 0.0 - 0.2 %  Comprehensive metabolic panel   Collection Time: 05/14/23 12:12 AM  Result Value Ref Range   Sodium 140 135 - 145 mmol/L   Potassium 3.5 3.5 - 5.1 mmol/L   Chloride 107 98 - 111 mmol/L   CO2 24 22 - 32 mmol/L   Glucose, Bld 105 (H) 70 - 99 mg/dL   BUN 7 6 - 20 mg/dL   Creatinine, Ser 1.61 0.44 - 1.00 mg/dL   Calcium 8.8 (L) 8.9 - 10.3 mg/dL   Total Protein 6.2 (L) 6.5 - 8.1 g/dL   Albumin 2.5 (L) 3.5 - 5.0 g/dL   AST 59 (H) 15 - 41 U/L   ALT 33 0 - 44 U/L   Alkaline Phosphatase 220 (H) 38 - 126 U/L   Total Bilirubin 0.7 0.0 - 1.2 mg/dL   GFR, Estimated >09 >60 mL/min   Anion gap 9 5 - 15  Protein / creatinine ratio, urine   Collection Time: 05/14/23  1:05 AM  Result Value Ref Range   Creatinine, Urine 103 mg/dL   Total Protein, Urine 12 mg/dL   Protein Creatinine Ratio 0.12 0.00 - 0.15 mg/mg[Cre]    Patient Active Problem List   Diagnosis Date Noted   Polyhydramnios in third trimester 04/17/2023   Excessive weight gain affecting pregnancy 04/17/2023   Uterine fibroid complicating antenatal care, baby not yet delivered 03/29/2023   History of gestational diabetes in prior pregnancy, currently pregnant 11/10/2022   Chronic hypertension affecting pregnancy 11/10/2022   Multigravida of advanced maternal age 26/13/2024   Obesity affecting pregnancy, antepartum 11/10/2022   Supervision of [place] risk pregnancy, antepartum 10/24/2022   Hypertension 06/28/2022    Assessment/Plan:  Erin Johnston is a 38 y.o. [id number] at [redacted]w[redacted]d here for IOL for CHTN  #Labor: Dual cytotec. Pt would  like to avoid FB if possible. Augmentation with AROM & Pitocin as needed #Pain: Pt aware of options #FWB: Cat I #GBS status:  negative #Feeding: Breastmilk  #Reproductive Life planning: Nexplanon #Circ:  yes  Wyn Forster, MD  05/14/2023, 2:36 AM

## 2023-05-15 ENCOUNTER — Encounter (HOSPITAL_COMMUNITY): Payer: Self-pay | Admitting: Obstetrics & Gynecology

## 2023-05-15 DIAGNOSIS — O09523 Supervision of elderly multigravida, third trimester: Secondary | ICD-10-CM

## 2023-05-15 DIAGNOSIS — O99214 Obesity complicating childbirth: Secondary | ICD-10-CM

## 2023-05-15 DIAGNOSIS — Z3A39 39 weeks gestation of pregnancy: Secondary | ICD-10-CM

## 2023-05-15 DIAGNOSIS — O1002 Pre-existing essential hypertension complicating childbirth: Secondary | ICD-10-CM

## 2023-05-15 DIAGNOSIS — O403XX0 Polyhydramnios, third trimester, not applicable or unspecified: Secondary | ICD-10-CM

## 2023-05-15 LAB — CBC
HCT: 37.6 % (ref 36.0–46.0)
Hemoglobin: 12.6 g/dL (ref 12.0–15.0)
MCH: 28.8 pg (ref 26.0–34.0)
MCHC: 33.5 g/dL (ref 30.0–36.0)
MCV: 86 fL (ref 80.0–100.0)
Platelets: 181 10*3/uL (ref 150–400)
RBC: 4.37 MIL/uL (ref 3.87–5.11)
RDW: 15.8 % — ABNORMAL HIGH (ref 11.5–15.5)
WBC: 12.7 10*3/uL — ABNORMAL HIGH (ref 4.0–10.5)
nRBC: 0 % (ref 0.0–0.2)

## 2023-05-15 MED ORDER — METHYLERGONOVINE MALEATE 0.2 MG PO TABS: 0.2000 mg | ORAL_TABLET | ORAL | Status: DC | PRN

## 2023-05-15 MED ORDER — POTASSIUM CHLORIDE CRYS ER 20 MEQ PO TBCR
20.0000 meq | EXTENDED_RELEASE_TABLET | Freq: Two times a day (BID) | ORAL | Status: DC
  Administered 2023-05-15 – 2023-05-16 (×3): 20 meq via ORAL
  Filled 2023-05-15 (×3): qty 1

## 2023-05-15 MED ORDER — FUROSEMIDE 20 MG PO TABS
20.0000 mg | ORAL_TABLET | Freq: Every day | ORAL | Status: DC
  Administered 2023-05-15 – 2023-05-16 (×2): 20 mg via ORAL
  Filled 2023-05-15 (×2): qty 1

## 2023-05-15 MED ORDER — ONDANSETRON HCL 4 MG PO TABS: 4.0000 mg | ORAL_TABLET | ORAL | Status: DC | PRN

## 2023-05-15 MED ORDER — ACETAMINOPHEN 325 MG PO TABS: 650.0000 mg | ORAL_TABLET | ORAL | Status: DC | PRN

## 2023-05-15 MED ORDER — WITCH HAZEL-GLYCERIN EX PADS
1.0000 | MEDICATED_PAD | CUTANEOUS | Status: DC | PRN
Start: 2023-05-15 — End: 2023-05-16

## 2023-05-15 MED ORDER — PRENATAL MULTIVITAMIN CH
1.0000 | ORAL_TABLET | Freq: Every day | ORAL | Status: DC
  Administered 2023-05-15 – 2023-05-16 (×2): 1 via ORAL
  Filled 2023-05-15 (×2): qty 1

## 2023-05-15 MED ORDER — METHYLERGONOVINE MALEATE 0.2 MG/ML IJ SOLN: 0.2000 mg | INTRAMUSCULAR | Status: DC | PRN

## 2023-05-15 MED ORDER — FERROUS SULFATE 325 (65 FE) MG PO TABS
325.0000 mg | ORAL_TABLET | ORAL | Status: DC
  Administered 2023-05-15: 325 mg via ORAL
  Filled 2023-05-15: qty 1

## 2023-05-15 MED ORDER — ONDANSETRON HCL 4 MG/2ML IJ SOLN: 4.0000 mg | INTRAMUSCULAR | Status: DC | PRN

## 2023-05-15 MED ORDER — DIBUCAINE (PERIANAL) 1 % EX OINT: 1.0000 | TOPICAL_OINTMENT | CUTANEOUS | Status: DC | PRN

## 2023-05-15 MED ORDER — SIMETHICONE 80 MG PO CHEW: 80.0000 mg | CHEWABLE_TABLET | ORAL | Status: DC | PRN

## 2023-05-15 MED ORDER — MEDROXYPROGESTERONE ACETATE 150 MG/ML IM SUSP: 150.0000 mg | INTRAMUSCULAR | Status: DC | PRN

## 2023-05-15 MED ORDER — BENZOCAINE-MENTHOL 20-0.5 % EX AERO: 1.0000 | INHALATION_SPRAY | CUTANEOUS | Status: DC | PRN

## 2023-05-15 MED ORDER — BISACODYL 10 MG RE SUPP: 10.0000 mg | Freq: Every day | RECTAL | Status: DC | PRN

## 2023-05-15 MED ORDER — FLEET ENEMA RE ENEM: 1.0000 | ENEMA | Freq: Every day | RECTAL | Status: DC | PRN

## 2023-05-15 MED ORDER — SENNOSIDES-DOCUSATE SODIUM 8.6-50 MG PO TABS
2.0000 | ORAL_TABLET | ORAL | Status: DC
  Administered 2023-05-15 – 2023-05-16 (×2): 2 via ORAL
  Filled 2023-05-15 (×2): qty 2

## 2023-05-15 MED ORDER — TETANUS-DIPHTH-ACELL PERTUSSIS 5-2.5-18.5 LF-MCG/0.5 IM SUSY: 0.5000 mL | PREFILLED_SYRINGE | Freq: Once | INTRAMUSCULAR | Status: DC

## 2023-05-15 MED ORDER — COCONUT OIL OIL: 1.0000 | TOPICAL_OIL | Status: DC | PRN

## 2023-05-15 MED ORDER — IBUPROFEN 600 MG PO TABS
600.0000 mg | ORAL_TABLET | Freq: Four times a day (QID) | ORAL | Status: DC
  Administered 2023-05-15 – 2023-05-16 (×6): 600 mg via ORAL
  Filled 2023-05-15 (×6): qty 1

## 2023-05-15 MED ORDER — MEASLES, MUMPS & RUBELLA VAC IJ SOLR: 0.5000 mL | Freq: Once | INTRAMUSCULAR | Status: DC

## 2023-05-15 NOTE — Discharge Summary (Signed)
 Postpartum Discharge Summary  Date of Service updated***     Patient Name: Erin Johnston DOB: 04-May-1985 MRN: [id number]  Date of admission: 05/14/2023 Delivery date:05/15/2023 Delivering provider: Jacklyn Shell Date of discharge: 05/15/2023  Admitting diagnosis: Chronic hypertension affecting pregnancy [O10.919] Intrauterine pregnancy: [redacted]w[redacted]d     Secondary diagnosis:  Principal Problem:   Chronic hypertension affecting pregnancy Active Problems:   Vaginal delivery  Additional problems: obesity    Discharge diagnosis: Term Pregnancy Delivered and CHTN                                              Post partum procedures:{Postpartum procedures:23558} Augmentation: AROM, Pitocin, Cytotec, and IP Foley Complications: None  Hospital course: Induction of Labor With Vaginal Delivery   38 y.o. yo [id number] at [redacted]w[redacted]d was admitted to the hospital 05/14/2023 for induction of labor.  Indication for induction:  CHTN .  Patient had an labor course complicated by prolonged active phase Membrane Rupture Time/Date: 11:27 AM,05/14/2023  Delivery Method:Vaginal, Spontaneous Operative Delivery:N/A Episiotomy: None Lacerations:  None Details of delivery can be found in separate delivery note.  Patient had a postpartum course complicated by***. Patient is discharged home 05/15/23.  Newborn Data: Birth date:05/15/2023 Birth time:3:38 AM Gender:Female Living status:Living Apgars:8 ,9  Weight:   Magnesium Sulfate received: No BMZ received: No Rhophylac:N/A MMR:N/A T-DaP:{Tdap:23962} Flu: N/A RSV Vaccine received: {RSV:31013} Transfusion:{Transfusion received:30440034}  Immunizations received: Immunization History  Administered Date(s) Administered   MMR 10/21/2015   Tdap 12/31/2013    Physical exam  Vitals:   05/15/23 0236 05/15/23 0309 05/15/23 0323 05/15/23 0400  BP: (!) 155/99 121/74  128/71  Pulse: (!) 105 (!) 124  (!) 126  Resp:  17    Temp:   98.3 F (36.8 C)    TempSrc:   Oral   SpO2:      Weight:      Height:       General: {Exam; general:21111117} Lochia: {Desc; appropriate/inappropriate:30686::"appropriate"} Uterine Fundus: {Desc; firm/soft:30687} Incision: {Exam; incision:21111123} DVT Evaluation: {Exam; dvt:2111122} Labs: Lab Results  Component Value Date   WBC 6.8 05/14/2023   HGB 13.2 05/14/2023   HCT 39.5 05/14/2023   MCV 86.2 05/14/2023   PLT 191 05/14/2023      Latest Ref Rng & Units 05/14/2023   12:12 AM  CMP  Glucose 70 - 99 mg/dL 329   BUN 6 - 20 mg/dL 7   Creatinine 5.18 - 8.41 mg/dL 6.60   Sodium 630 - 160 mmol/L 140   Potassium 3.5 - 5.1 mmol/L 3.5   Chloride 98 - 111 mmol/L 107   CO2 22 - 32 mmol/L 24   Calcium 8.9 - 10.3 mg/dL 8.8   Total Protein 6.5 - 8.1 g/dL 6.2   Total Bilirubin 0.0 - 1.2 mg/dL 0.7   Alkaline Phos 38 - 126 U/L 220   AST 15 - 41 U/L 59   ALT 0 - 44 U/L 33    Edinburgh Score:     No data to display         No data recorded  After visit meds:  Allergies as of 05/15/2023       Reactions   Bee Pollen Itching   Tomato Itching     Med Rec must be completed prior to using this [facility]***        Discharge home in stable  condition Infant Feeding: {Baby feeding:23562} Infant Disposition:{CHL IP OB HOME WITH MOLMBE:67544} Discharge instruction: per After Visit Summary and Postpartum booklet. Activity: Advance as tolerated. Pelvic rest for 6 weeks.  Diet: {OB BEEF:00712197} Future Appointments:No future appointments. Follow up Visit:   Please schedule this patient for a In person postpartum visit in 4 weeks with the following provider: Any provider. Additional Postpartum F/U:BP check 1 week  [place] risk pregnancy complicated by: HTN Delivery mode:  Vaginal, Spontaneous Anticipated Birth Control:  Nexplanon placed  ***   05/15/2023 Jacklyn Shell, CNM

## 2023-05-15 NOTE — Anesthesia Postprocedure Evaluation (Signed)
 Anesthesia Post Note  Patient: Erin Johnston  Procedure(s) Performed: AN AD HOC LABOR EPIDURAL     Patient location during evaluation: Mother Baby Anesthesia Type: Epidural Level of consciousness: awake and alert and oriented Pain management: satisfactory to patient Vital Signs Assessment: post-procedure vital signs reviewed and stable Respiratory status: respiratory function stable Cardiovascular status: stable Postop Assessment: no headache, no backache, epidural receding, patient able to bend at knees, no signs of nausea or vomiting, adequate PO intake and able to ambulate Anesthetic complications: no   No notable events documented.  Last Vitals:  Vitals:   05/15/23 0620 05/15/23 1035  BP: (!) 159/102 127/83  Pulse: (!) 116 87  Resp: 16 18  Temp: 36.6 C 36.7 C  SpO2: 100% 100%    Last Pain:  Vitals:   05/15/23 1145  TempSrc:   PainSc: 2    Pain Goal:                   Aundria Rud, CRNA

## 2023-05-15 NOTE — Progress Notes (Signed)
 Patient Vitals for the past 4 hrs:  BP Temp Temp src Pulse Resp SpO2  05/15/23 0101 (!) 155/91 99.7 F (37.6 C) Oral (!) 125 17 --  05/15/23 0031 (!) 141/101 -- -- (!) 116 -- --  05/15/23 0000 (!) 133/91 -- -- (!) 121 -- 98 %  05/14/23 2331 (!) 138/103 -- -- (!) 110 -- --  05/14/23 2301 (!) 140/84 (!) 100.7 F (38.2 C) Axillary (!) 109 17 --  05/14/23 2238 -- 99 F (37.2 C) Oral -- -- --  05/14/23 2231 132/89 -- -- (!) 119 -- --  05/14/23 2201 137/87 -- -- (!) 107 -- --  05/14/23 2200 -- 99.5 F (37.5 C) Oral -- 18 --   Cx now 8cms, but thickening up again. Still so groggy from earlier benadryl, doesn't want any more. FHR 145, Cat 1, MVUs 180.  PItocin now at 10 mu/min, will titrate up to keep labor adequate.

## 2023-05-16 ENCOUNTER — Telehealth: Payer: Self-pay

## 2023-05-16 ENCOUNTER — Other Ambulatory Visit (HOSPITAL_COMMUNITY): Payer: Self-pay

## 2023-05-16 DIAGNOSIS — Z30017 Encounter for initial prescription of implantable subdermal contraceptive: Secondary | ICD-10-CM

## 2023-05-16 MED ORDER — ETONOGESTREL 68 MG ~~LOC~~ IMPL
68.0000 mg | DRUG_IMPLANT | Freq: Once | SUBCUTANEOUS | Status: AC
  Administered 2023-05-16: 68 mg via SUBCUTANEOUS
  Filled 2023-05-16: qty 1

## 2023-05-16 MED ORDER — NIFEDIPINE ER 30 MG PO TB24
30.0000 mg | ORAL_TABLET | Freq: Two times a day (BID) | ORAL | 2 refills | Status: DC
  Filled 2023-05-16: qty 60, 30d supply, fill #0

## 2023-05-16 MED ORDER — POTASSIUM CHLORIDE CRYS ER 20 MEQ PO TBCR
20.0000 meq | EXTENDED_RELEASE_TABLET | Freq: Every day | ORAL | 0 refills | Status: AC
  Filled 2023-05-16: qty 3, 3d supply, fill #0

## 2023-05-16 MED ORDER — NIFEDIPINE ER OSMOTIC RELEASE 30 MG PO TB24
30.0000 mg | ORAL_TABLET | Freq: Once | ORAL | Status: AC
  Administered 2023-05-16: 30 mg via ORAL

## 2023-05-16 MED ORDER — LIDOCAINE HCL 1 % IJ SOLN
0.0000 mL | Freq: Once | INTRAMUSCULAR | Status: AC | PRN
  Administered 2023-05-16: 3 mL via INTRADERMAL
  Filled 2023-05-16: qty 20

## 2023-05-16 MED ORDER — FUROSEMIDE 20 MG PO TABS
20.0000 mg | ORAL_TABLET | Freq: Every day | ORAL | 0 refills | Status: AC
  Filled 2023-05-16: qty 3, 3d supply, fill #0

## 2023-05-16 MED ORDER — ACETAMINOPHEN 500 MG PO TABS
1000.0000 mg | ORAL_TABLET | Freq: Four times a day (QID) | ORAL | 0 refills | Status: AC | PRN
  Filled 2023-05-16: qty 100, 13d supply, fill #0

## 2023-05-16 MED ORDER — IBUPROFEN 600 MG PO TABS
600.0000 mg | ORAL_TABLET | Freq: Four times a day (QID) | ORAL | 1 refills | Status: AC | PRN
  Filled 2023-05-16: qty 30, 8d supply, fill #0

## 2023-05-16 NOTE — Progress Notes (Addendum)
 Post-Placental Nexplanon Insertion Procedure Note  Patient was identified. Informed consent was signed, signed copy in chart. A time-out was performed.    The insertion site was identified 8-10 cm (3-4 inches) from the medial epicondyle of the humerus and 3-5 cm (1.25-2 inches) posterior to (below) the sulcus (groove) between the biceps and triceps muscles of the patient's left arm and marked. The site was prepped in the usual sterile fashion. Pt was prepped with alcohol swab and then injected with 2.5 cc of 1% lidocaine. The site was prepped with alcohol again. Nexplanon removed form packaging,  Device confirmed in needle, then inserted full length of needle and withdrawn per handbook instructions. Provider and patient verified presence of the implant in the woman's arm by palpation. Pt insertion site was covered with adhesive bandage and pressure bandage. There was minimal blood loss. Patient tolerated procedure well.  Patient was given post procedure instructions and Nexplanon user card with expiration date. Condoms were recommended for STI prevention. Patient was asked to keep the pressure dressing on for 24 hours to minimize bruising and keep the adhesive bandage on for 3-5 days. The patient verbalized understanding of the plan of care and agrees.   SN [id number] EXP 2027-02 Lot [id number]   Joie Bimler, DO MPH  Family Medicine, PGY-1   I was present for the Nexplanon insertion and agree with the above resident's note.  LEFTWICH-KIRBY, Ramandeep Arington Certified Nurse-Midwife

## 2023-05-16 NOTE — Telephone Encounter (Signed)
 Attempted to contact about babyscripts alert of 137/105. Pt did not answer, left vm. Notes show that pt was just discharged from the hospital today

## 2023-05-17 ENCOUNTER — Ambulatory Visit: Payer: Medicaid Other

## 2023-05-17 ENCOUNTER — Encounter: Payer: Self-pay | Admitting: Family Medicine

## 2023-05-17 ENCOUNTER — Telehealth: Payer: Self-pay

## 2023-05-17 NOTE — Telephone Encounter (Signed)
 S/w pt to follow up after babyscripts alert. Pt stated that she had taken Nifedipine maybe an hour before checking her BP and she was moving around with the baby a lot. Pt stated that she will re-check BP later and log it. Denies headaches, dizziness, blurry vision. Advised to rest before and during BP check. Pt also states that she can tell swelling in feet/ankles is getting better since starting Lasix. Advised that if abnormal symptoms occur after hours, report to hospital, pt agreed.

## 2023-05-18 ENCOUNTER — Encounter: Payer: Self-pay | Admitting: Obstetrics & Gynecology

## 2023-05-18 ENCOUNTER — Telehealth: Payer: Self-pay

## 2023-05-18 NOTE — Telephone Encounter (Signed)
 Attempted to contact about babyscripts alert, no answer, left vm

## 2023-05-19 ENCOUNTER — Encounter: Payer: Self-pay | Admitting: Obstetrics & Gynecology

## 2023-05-19 ENCOUNTER — Other Ambulatory Visit: Payer: Self-pay | Admitting: Obstetrics & Gynecology

## 2023-05-19 DIAGNOSIS — O10419 Pre-existing secondary hypertension complicating pregnancy, unspecified trimester: Secondary | ICD-10-CM

## 2023-05-19 DIAGNOSIS — O0990 Supervision of high risk pregnancy, unspecified, unspecified trimester: Secondary | ICD-10-CM

## 2023-05-19 MED ORDER — NIFEDIPINE ER 60 MG PO TB24: 60.0000 mg | ORAL_TABLET | Freq: Two times a day (BID) | ORAL | 2 refills | Status: AC

## 2023-05-19 NOTE — Progress Notes (Signed)
 Nifedipine CC increased to 60 mg po bid given persistently elevated postpartum blood pressures.   Patient notified by MyChart.   Jaynie Collins, MD

## 2023-05-21 ENCOUNTER — Telehealth: Payer: Self-pay

## 2023-05-21 NOTE — Telephone Encounter (Signed)
 TC to pt to discuss elevated babyscripts reading. No answer, left VM with callback number. Also informed pt to check mychart message from Dr. Macon Large regarding increasing procardia.

## 2023-05-22 ENCOUNTER — Ambulatory Visit

## 2023-05-23 ENCOUNTER — Ambulatory Visit

## 2023-05-23 VITALS — BP 151/99 | HR 92 | Ht 60.0 in | Wt 181.1 lb

## 2023-05-23 DIAGNOSIS — Z0131 Encounter for examination of blood pressure with abnormal findings: Secondary | ICD-10-CM

## 2023-05-23 DIAGNOSIS — Z0130 Encounter for examination of blood pressure without abnormal findings: Secondary | ICD-10-CM

## 2023-05-23 NOTE — Progress Notes (Signed)
..  Subjective:  Erin Johnston is a 38 y.o. female here for BP check. Pt states that she is currently taking Nifedipine 30mg  BID, last took it 2 hours ago. She states that she was unaware that provider had increased the dosage.  Hypertension ROS: not taking medications as instructed, no medication side effects noted, no TIA's, no chest pain on exertion, no dyspnea on exertion, and no swelling of ankles.    Objective:  BP (!) 151/99   Pulse 92   Ht 5' (1.524 m)   Wt 181 lb 1.6 oz (82.1 kg)   LMP 08/14/2022 (Exact Date)   Breastfeeding No   BMI 35.37 kg/m  Retake BP: 137/93 Pulse 91 Appearance alert, well appearing, and in no distress. General exam BP noted to be well controlled today in office.    Assessment:   Blood Pressure poorly controlled.   Plan:  Orders and follow up as documented in patient record. Advised to pick up new rx for Nidefipiene and report abnormal symptoms or go to [facility] if they occur. Follow up x 1 week for repeat BP check

## 2023-05-30 ENCOUNTER — Ambulatory Visit

## 2023-05-30 VITALS — BP 120/84 | HR 94

## 2023-05-30 DIAGNOSIS — Z0130 Encounter for examination of blood pressure without abnormal findings: Secondary | ICD-10-CM

## 2023-05-30 NOTE — Progress Notes (Signed)
 Subjective:  Erin Johnston is a 38 y.o. female here for BP check.   Hypertension ROS: taking medications as instructed, no medication side effects noted, no TIA's, no chest pain on exertion, no dyspnea on exertion, and no swelling of ankles.    Objective:  BP 120/84   Pulse 94   LMP 08/14/2022 (Exact Date)   Appearance alert, well appearing, and in no distress. General exam BP noted to be well controlled today in office.  Pt had increased procardia to 60mg  per Dr. Macon Large instructions. Has been taking at home and reports all normal readings since increasing dose to 60mg .   Assessment:   Blood Pressure well controlled.   Plan:  Current treatment plan is effective, no change in therapy.Marland Kitchen

## 2023-06-13 ENCOUNTER — Ambulatory Visit: Admitting: Obstetrics
